# Patient Record
Sex: Female | Born: 2000 | Race: White | Hispanic: No | Marital: Single | Smoking: Never smoker
Health system: Southern US, Community
[De-identification: ages and names within clinical notes are randomized; demographics above are authoritative.]

## PROBLEM LIST (undated history)

## (undated) DIAGNOSIS — F329 Major depressive disorder, single episode, unspecified: Secondary | ICD-10-CM

## (undated) DIAGNOSIS — F32A Depression, unspecified: Secondary | ICD-10-CM

## (undated) DIAGNOSIS — S060X9A Concussion with loss of consciousness of unspecified duration, initial encounter: Secondary | ICD-10-CM

## (undated) HISTORY — PX: KNEE SURGERY: SHX244

## (undated) HISTORY — DX: Depression, unspecified: F32.A

## (undated) HISTORY — DX: Concussion with loss of consciousness of unspecified duration, initial encounter: S06.0X9A

## (undated) HISTORY — DX: Major depressive disorder, single episode, unspecified: F32.9

---
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
## 2019-01-06 ENCOUNTER — Other Ambulatory Visit: Payer: Self-pay

## 2019-01-06 ENCOUNTER — Ambulatory Visit (INDEPENDENT_AMBULATORY_CARE_PROVIDER_SITE_OTHER): Payer: 59 | Admitting: Psychology

## 2019-01-06 DIAGNOSIS — F411 Generalized anxiety disorder: Secondary | ICD-10-CM | POA: Diagnosis not present

## 2019-01-06 DIAGNOSIS — F330 Major depressive disorder, recurrent, mild: Secondary | ICD-10-CM | POA: Diagnosis not present

## 2019-01-06 NOTE — Progress Notes (Signed)
Virtual Visit via Video Note  I connected with Caroline Lopez on 01/06/19 at  2:30 PM EST by a video enabled telemedicine application and verified that I am speaking with the correct person using two identifiers.   I discussed the limitations of evaluation and management by telemedicine and the availability of in person appointments. The patient expressed understanding and agreed to proceed.   I discussed the assessment and treatment plan with the patient. The patient was provided an opportunity to ask questions and all were answered. The patient agreed with the plan and demonstrated an understanding of the instructions.   The patient was advised to call back or seek an in-person evaluation if the symptoms worsen or if the condition fails to improve as anticipated.  I provided 32 minutes of non-face-to-face time during this encounter.   [name] [facility]    THERAPIST PROGRESS NOTE  Session Time: 2.38pm-3.10pm  Participation Level: Active  Behavioral Response: Well GroomedAlertaffect wnl  Type of Therapy: Individual Therapy  Treatment Goals addressed: Diagnosis: GAD, MDD and goal 1  Interventions: CBT and Strength-based  Summary: Caroline Lopez is a 18 y.o. female who presents with affect bright. Pt reported that she finished her last final this week and feels good about the semester- pt completed w/ As and 1 B.  Pt reported that she did struggle a couple of weeks w/ mood when w/out the effexor.  Pt reported that she has done well over the past several of weeks.  Pt reported that she has improved w/ her sleep schedule, improved w/ less picking and improved w/ decreased ruminating about previous relationship.  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 Pt discussed her plans for the winter break w/ work, social and family time. Pt requested to continue w/ counselor during break more frequently as won't have access to campus services.    Suicidal/Homicidal: Nowithout intent/plan  Therapist Response: Assessed pt current functioning per pt report.  Processed w/pt coping w/ anxiety and stress through the semester and what coping skills and self care assisted.  Explored w/pt transition returning to home.  Discussed f/u during this time.  Plan: Return again in 3 weeks, via webex. Pt will f/u biweekly when home for winter break.  Pt to schedule f/u w/ Dr. Montel Culver for medication management.   Diagnosis: GAD, MDD  [name], [facility] 01/06/2019

## 2019-01-24 ENCOUNTER — Other Ambulatory Visit: Payer: Self-pay

## 2019-01-24 ENCOUNTER — Encounter (HOSPITAL_COMMUNITY): Payer: Self-pay | Admitting: Psychiatry

## 2019-01-24 ENCOUNTER — Ambulatory Visit (INDEPENDENT_AMBULATORY_CARE_PROVIDER_SITE_OTHER): Payer: 59 | Admitting: Psychiatry

## 2019-01-24 DIAGNOSIS — F3342 Major depressive disorder, recurrent, in full remission: Secondary | ICD-10-CM | POA: Diagnosis not present

## 2019-01-24 DIAGNOSIS — F411 Generalized anxiety disorder: Secondary | ICD-10-CM | POA: Diagnosis not present

## 2019-01-24 MED ORDER — BUPROPION HCL ER (XL) 150 MG PO TB24: 150.0000 mg | ORAL_TABLET | Freq: Every day | ORAL | 1 refills | Status: DC

## 2019-01-24 MED ORDER — BUSPIRONE HCL 10 MG PO TABS: 20.0000 mg | ORAL_TABLET | Freq: Two times a day (BID) | ORAL | 2 refills | Status: DC

## 2019-01-24 MED ORDER — TRAZODONE HCL 50 MG PO TABS: ORAL_TABLET | ORAL | 1 refills | Status: DC

## 2019-01-24 MED ORDER — HYDROXYZINE HCL 10 MG PO TABS: 10.0000 mg | ORAL_TABLET | Freq: Three times a day (TID) | ORAL | 2 refills | Status: AC | PRN

## 2019-01-24 MED ORDER — VENLAFAXINE HCL ER 75 MG PO CP24: 225.0000 mg | ORAL_CAPSULE | Freq: Every day | ORAL | 2 refills | Status: DC

## 2019-01-24 NOTE — Progress Notes (Signed)
BH MD/PA/NP OP Progress Note  01/24/2019 9:54 AM Caroline Lopez  MRN:  [id number] Interview was conducted by phone and I verified that I was speaking with the correct person using two identifiers. I discussed the limitations of evaluation and management by telemedicine and  the availability of in person appointments. Patient expressed understanding and agreed to proceed.  Chief Complaint: Problems with focusing/sustaining attention.  HPI: 18 yo single white female who has been previously followed by Dr. Milana Lopez for MDD/GAD. Last visit with her was. Caroline Lopez reports some anxiety and no depression. She takes hydroxyzine 10 mg prn with good effect. She has been on Effexor 225 mg, buspirone 20 mg bid and trazodone prn insomnia. All appear to work well. No adverse effects reported. No hopelessness, no SI reported. She started at [employer] last semester and noticed more problems with focusing, concetration, struggling with assignment completion, procrastination. These problems are not new but seems to become more apparent in a new environment. She has two fist cousions diagnosed with ADHD. There is no hyperactivity component described.   Visit Diagnosis:    ICD-10-CM   1. GAD (generalized anxiety disorder)  F41.1   2. Major depressive disorder, recurrent episode, in full remission (HCC)  F33.42     Past Psychiatric History: Please see intake H&P.  Past Medical History:  Past Medical History:  Diagnosis Date  . Concussion 04/2017  . Depression     Past Surgical History:  Procedure Laterality Date  . KNEE SURGERY     2017    Family Psychiatric History: Reviewed.  Family History:  Family History  Problem Relation Age of Onset  . Bipolar disorder Maternal Uncle   . Drug abuse Maternal Uncle   . Suicidality Maternal Uncle   . Suicidality Other        completed suicide  . ADD / ADHD Cousin     Social History:  Social History   Socioeconomic History  . Marital status: Single    Spouse name:  Not on file  . Number of children: Not on file  . Years of education: Not on file  . Highest education level: Not on file  Occupational History  . Occupation: Consulting civil engineer  Social Needs  . Financial resource strain: Not on file  . Food insecurity    Worry: Not on file    Inability: Not on file  . Transportation needs    Medical: Not on file    Non-medical: Not on file  Tobacco Use  . Smoking status: Never Smoker  . Smokeless tobacco: Never Used  Substance and Sexual Activity  . Alcohol use: No  . Drug use: No  . Sexual activity: Yes    Birth control/protection: Implant  Lifestyle  . Physical activity    Days per week: Not on file    Minutes per session: Not on file  . Stress: Not on file  Relationships  . Social Musician on phone: Not on file    Gets together: Not on file    Attends religious service: Not on file    Active member of club or organization: Not on file    Attends meetings of clubs or organizations: Not on file    Relationship status: Not on file  Other Topics Concern  . Not on file  Social History Narrative  . Not on file    Allergies: No Known Allergies  Metabolic Disorder Labs: No results found for: HGBA1C, MPG No results found for: PROLACTIN No results  found for: CHOL, TRIG, HDL, CHOLHDL, VLDL, LDLCALC Lab Results  Component Value Date   TSH 2.76 06/12/2017    Therapeutic Level Labs: No results found for: LITHIUM No results found for: VALPROATE No components found for:  CBMZ  Current Medications: Current Outpatient Medications  Medication Sig Dispense Refill  . buPROPion (WELLBUTRIN XL) 150 MG 24 hr tablet Take 1 tablet (150 mg total) by mouth daily. 30 tablet 1  . busPIRone (BUSPAR) 10 MG tablet Take 2 tablets (20 mg total) by mouth 2 (two) times daily. 120 tablet 2  . etonogestrel (NEXPLANON) 68 MG IMPL implant 1 each by Subdermal route once.    . hydrOXYzine (ATARAX/VISTARIL) 10 MG tablet Take 1 tablet (10 mg total) by mouth 3  (three) times daily as needed for anxiety. 90 tablet 2  . traZODone (DESYREL) 50 MG tablet Take one or two each evening 120 tablet 1  . venlafaxine XR (EFFEXOR-XR) 75 MG 24 hr capsule Take 3 capsules (225 mg total) by mouth daily with breakfast. Take 3 each morning 90 capsule 2   No current facility-administered medications for this visit.       Psychiatric Specialty Exam: Review of Systems  Psychiatric/Behavioral: The patient is nervous/anxious.   All other systems reviewed and are negative.   There were no vitals taken for this visit.There is no height or weight on file to calculate BMI.  General Appearance: NA  Eye Contact:  NA  Speech:  Clear and Coherent and Normal Rate  Volume:  Normal  Mood:  Anxious  Affect:  NA  Thought Process:  Goal Directed and Linear  Orientation:  Full (Time, Place, and Person)  Thought Content: Logical   Suicidal Thoughts:  No  Homicidal Thoughts:  No  Memory:  Immediate;   Good Recent;   Good Remote;   Good  Judgement:  Good  Insight:  Good  Psychomotor Activity:  NA  Concentration:  Concentration: Fair  Recall:  Good  Fund of Knowledge: Good  Language: Good  Akathisia:  Negative  Handed:  Right  AIMS (if indicated): not done  Assets:  Communication Skills Desire for Improvement Housing Physical Health Social Support Talents/Skills  ADL's:  Intact  Cognition: WNL  Sleep:  Fair   Screenings: GAD-7     Counselor from 09/09/2017 in BEHAVIORAL HEALTH OUTPATIENT THERAPY [place]  Total GAD-7 Score  19    PHQ2-9     Counselor from 09/09/2017 in BEHAVIORAL HEALTH OUTPATIENT THERAPY [place]  PHQ-2 Total Score  5  PHQ-9 Total Score  23       Assessment and Plan: 18 yo single white female who has been previously followed by Dr. Milana KidneyHoover for MDD/GAD. Last visit with her was. Caroline Lopez reports some anxiety and no depression. She takes hydroxyzine 10 mg prn with good effect. She has been on Effexor 225 mg, buspirone 20 mg bid and trazodone  prn insomnia. All appear to work well. No adverse effects reported. No hopelessness, no SI reported. She started at [employer] last semester and noticed more problems with focusing, concetration, struggling with assignment completion, procrastination. These problems are not new but seems to become more apparent in a new environment. She has two fist cousions diagnosed with ADHD. There is no hyperactivity component described.  Dx: GAD; MDD in remission; Suspected ADD  Plan: Continue current meds unchanged but add bupropion XL 150 mg in AM. If focusing does not improve we could try a stimulant next. Next appointment in 5 weeks. The plan was discussed  with patient who had an opportunity to ask questions and these were all answered. I spend 25 minutes in videoconferencing with the patient.    Stephanie Acre, MD 01/24/2019, 9:54 AM

## 2019-01-27 ENCOUNTER — Ambulatory Visit (INDEPENDENT_AMBULATORY_CARE_PROVIDER_SITE_OTHER): Payer: 59 | Admitting: Psychology

## 2019-01-27 ENCOUNTER — Other Ambulatory Visit: Payer: Self-pay

## 2019-01-27 DIAGNOSIS — F330 Major depressive disorder, recurrent, mild: Secondary | ICD-10-CM

## 2019-01-27 DIAGNOSIS — F411 Generalized anxiety disorder: Secondary | ICD-10-CM | POA: Diagnosis not present

## 2019-01-27 NOTE — Progress Notes (Signed)
Virtual Visit via Video Note  I connected with Caroline Lopez on 01/27/19 at  9:00 AM EST by a video enabled telemedicine application and verified that I am speaking with the correct person using two identifiers.   I discussed the limitations of evaluation and management by telemedicine and the availability of in person appointments. The patient expressed understanding and agreed to proceed.     I discussed the assessment and treatment plan with the patient. The patient was provided an opportunity to ask questions and all were answered. The patient agreed with the plan and demonstrated an understanding of the instructions.   The patient was advised to call back or seek an in-person evaluation if the symptoms worsen or if the condition fails to improve as anticipated.  I provided 48 minutes of non-face-to-face time during this encounter.   [name] [facility]    THERAPIST PROGRESS NOTE  Session Time: 9am-9.48am  Participation Level: Active  Behavioral Response: Well GroomedAlertaffect wnl  Type of Therapy: Individual Therapy  Treatment Goals addressed: Diagnosis: GAD, MDD and goal 1.  Interventions: CBT and Strength-based  Summary: Caroline Lopez is a 18 y.o. female who presents with affect wnl.  pt reported returning home last week.  Pt reported that she is starting back to work today and feels nervous about as current outbreak of covid.  Pt knows short staff so doesn't want to quit, but wants to be safe and not risk bringing home.  Pt discussed how will see how feels about after working today.  Pt reported that different being home- as used to freedom of being at college and w/ car broken down- even more limited.  Pt reported that she has been nervous to run into her ex, but also wants to happen to just get out of the way.   Pt disclosed that she did cut about a month ago- first in long time and aware impact of not being on meds at the time and feeling overwhelmed. Pt acknowledged  that put self down about doing and is reframing that for self.  Pt reported some minor urges to cut since, but able to easily refrain and move forwared.  Pt discussed supports that she reached out to and that was helpful.  Pt discussed want for an emotional support animal and how to go about looking into school requirements for.   Suicidal/Homicidal: Nowithout intent/plan  Therapist Response: assessed pt current functioning per pt report.  Processed w/pt coping w/ transition home for winter break.  Explored w/pt worry about work, validated and discussed how can make decision as goes about continuing or not.  Processed wpt incident of superficial cut a month ago- triggers and ways of coping in future w/ use of supports.    Plan: Return again in 2 weeks, via webex.  Pt to f/u as scheduled w/ Dr. Montel Culver.  Diagnosis: [name], MDD   [name] [facility] 01/27/2019

## 2019-01-31 ENCOUNTER — Encounter: Payer: Self-pay | Admitting: *Deleted

## 2019-02-02 ENCOUNTER — Encounter: Payer: Self-pay | Admitting: Family Medicine

## 2019-02-02 ENCOUNTER — Ambulatory Visit (INDEPENDENT_AMBULATORY_CARE_PROVIDER_SITE_OTHER): Payer: Self-pay | Admitting: Family Medicine

## 2019-02-02 ENCOUNTER — Other Ambulatory Visit: Payer: Self-pay

## 2019-02-02 VITALS — BP 118/90 | HR 111 | Ht 66.0 in | Wt 264.0 lb

## 2019-02-02 DIAGNOSIS — M542 Cervicalgia: Secondary | ICD-10-CM

## 2019-02-02 DIAGNOSIS — M999 Biomechanical lesion, unspecified: Secondary | ICD-10-CM

## 2019-02-02 NOTE — Progress Notes (Signed)
[name] [address] [place], [place] [address] Phone: [phone] Subjective:   Bruce Donath, am serving as a scribe for Dr. Antoine Primas. This visit occurred during the SARS-CoV-2 public health emergency.  Safety protocols were in place, including screening questions prior to the visit, additional usage of staff PPE, and extensive cleaning of exam room while observing appropriate contact time as indicated for disinfecting solutions.  I'm seeing this patient by the request  of:    CC: Neck and upper back pain follow-up  GHW:[id number]  Caroline Lopez is a 18 y.o. female coming in with complaint of back pain. Is here for OMT to manage her back pain. Last adjustment July 2020. Patient states that she is having pain in between the scapula. Is back working at [employer] and feels like this is contributing to her pain. Continues to have knee and hip pain as well.      Past Medical History:  Diagnosis Date  . Concussion 04/2017  . Depression    Past Surgical History:  Procedure Laterality Date  . KNEE SURGERY     2017   Social History   Socioeconomic History  . Marital status: Single    Spouse name: Not on file  . Number of children: Not on file  . Years of education: Not on file  . Highest education level: Not on file  Occupational History  . Occupation: Consulting civil engineer  Tobacco Use  . Smoking status: Never Smoker  . Smokeless tobacco: Never Used  Substance and Sexual Activity  . Alcohol use: No  . Drug use: No  . Sexual activity: Yes    Birth control/protection: Implant  Other Topics Concern  . Not on file  Social History Narrative  . Not on file   Social Determinants of Health   Financial Resource Strain:   . Difficulty of Paying Living Expenses: Not on file  Food Insecurity:   . Worried About Programme researcher, broadcasting/film/video in the Last Year: Not on file  . Ran Out of Food in the Last Year: Not on file  Transportation Needs:   . Lack of  Transportation (Medical): Not on file  . Lack of Transportation (Non-Medical): Not on file  Physical Activity:   . Days of Exercise per Week: Not on file  . Minutes of Exercise per Session: Not on file  Stress:   . Feeling of Stress : Not on file  Social Connections:   . Frequency of Communication with Friends and Family: Not on file  . Frequency of Social Gatherings with Friends and Family: Not on file  . Attends Religious Services: Not on file  . Active Member of Clubs or Organizations: Not on file  . Attends Banker Meetings: Not on file  . Marital Status: Not on file   No Known Allergies Family History  Problem Relation Age of Onset  . Bipolar disorder Maternal Uncle   . Drug abuse Maternal Uncle   . Suicidality Maternal Uncle   . Suicidality Other        completed suicide  . ADD / ADHD Cousin     Current Outpatient Medications (Endocrine & Metabolic):  .  etonogestrel (NEXPLANON) 68 MG IMPL implant, 1 each by Subdermal route once.      Current Outpatient Medications (Other):  [name]  buPROPion (WELLBUTRIN XL) 150 MG 24 hr tablet, Take 1 tablet (150 mg total) by mouth daily. .  busPIRone (BUSPAR) 10 MG tablet, Take 2 tablets (  20 mg total) by mouth 2 (two) times daily. .  hydrOXYzine (ATARAX/VISTARIL) 10 MG tablet, Take 1 tablet (10 mg total) by mouth 3 (three) times daily as needed for anxiety. .  traZODone (DESYREL) 50 MG tablet, Take one or two each evening .  venlafaxine XR (EFFEXOR-XR) 75 MG 24 hr capsule, Take 3 capsules (225 mg total) by mouth daily with breakfast. Take 3 each morning    Past medical history, social, surgical and family history all reviewed in electronic medical record.  No pertanent information unless stated regarding to the chief complaint.   Review of Systems:  No , visual changes, nausea, vomiting, diarrhea, constipation, dizziness, abdominal pain, skin rash, fevers, chills, night sweats, weight loss, swollen lymph nodes, body aches,  joint swelling, muscle aches, chest pain, shortness of breath, mood changes.  Positive headaches  Objective  Blood pressure 118/90, pulse (!) 111, height 5\' 6"  (1.676 m), weight 264 lb (119.7 kg), SpO2 98 %.     General: No apparent distress alert and oriented x3 mood and affect normal, dressed appropriately.  HEENT: Pupils equal, extraocular movements intact  Respiratory: Patient's speak in full sentences and does not appear short of breath  Cardiovascular: No lower extremity edema, non tender, no erythema  Skin: Warm dry intact with no signs of infection or rash on extremities or on axial skeleton.  Abdomen: Soft nontender  Neuro: Cranial nerves II through XII are intact, neurovascularly intact in all extremities with 2+ DTRs and 2+ pulses.  Lymph: No lymphadenopathy of posterior or anterior cervical chain or axillae bilaterally.  Gait normal with good balance and coordination.  MSK:  Non tender with full range of motion and good stability and symmetric strength and tone of shoulders, elbows, wrist, hip, knee and ankles bilaterally.  Neck exam shows mild loss of lordosis.  Patient does have some mild tightness in the parascapular region bilaterally.  No spinous process tenderness noted no.  Negative Spurling's.  5 out of 5 strength of the upper extremities.  No real true loss of range of motion  Osteopathic findings  C4 flexed rotated and side bent left C6 flexed rotated and side bent left T6 extended rotated and side bent left  L1 flexed rotated and side bent right Sacrum right on right    Impression and Recommendations:     This case required medical decision making of moderate complexity. The above documentation has been reviewed and is accurate and complete Lyndal Pulley, DO       Note: This dictation was prepared with Dragon dictation along with smaller phrase technology. Any transcriptional errors that result from this process are unintentional.

## 2019-02-02 NOTE — Patient Instructions (Addendum)
  [address], [address]st [address] [place], [place] [address] Phone [phone]  Good to see you Happy Holidays!   

## 2019-02-02 NOTE — Assessment & Plan Note (Signed)
Decision today to treat with OMT was based on Physical Exam  After verbal consent patient was treated with HVLA, ME, FPR techniques in cervical, thoracic, lumbar and sacral areas  Patient tolerated the procedure well with improvement in symptoms  Patient given exercises, stretches and lifestyle modifications  See medications in patient instructions if given  Patient will follow up in 4-8 weeks 

## 2019-02-02 NOTE — Assessment & Plan Note (Signed)
Stable, respond well to osteopathic manipulation.  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  Patient will increase activity slowly over the course of several weeks.  Follow-up with me again 4 to 8 weeks

## 2019-02-07 ENCOUNTER — Ambulatory Visit (INDEPENDENT_AMBULATORY_CARE_PROVIDER_SITE_OTHER): Payer: 59 | Admitting: Psychology

## 2019-02-07 ENCOUNTER — Other Ambulatory Visit: Payer: Self-pay

## 2019-02-07 DIAGNOSIS — F330 Major depressive disorder, recurrent, mild: Secondary | ICD-10-CM

## 2019-02-07 DIAGNOSIS — F411 Generalized anxiety disorder: Secondary | ICD-10-CM

## 2019-02-07 NOTE — Progress Notes (Signed)
Virtual Visit via Video Note  I connected with Caroline Lopez on 02/07/19 at  9:00 AM EST by a video enabled telemedicine application and verified that I am speaking with the correct person using two identifiers.   I discussed the limitations of evaluation and management by telemedicine and the availability of in person appointments. The patient expressed understanding and agreed to proceed.     I discussed the assessment and treatment plan with the patient. The patient was provided an opportunity to ask questions and all were answered. The patient agreed with the plan and demonstrated an understanding of the instructions.   The patient was advised to call back or seek an in-person evaluation if the symptoms worsen or if the condition fails to improve as anticipated.  I provided 45 minutes of non-face-to-face time during this encounter.   [name] [facility]    THERAPIST PROGRESS NOTE  Session Time: 9am-9.45am  Participation Level: Active  Behavioral Response: Well GroomedAlertaffect wnl  Type of Therapy: Individual Therapy  Treatment Goals addressed: Diagnosis: GAD, MDD and goal 1.  Interventions: CBT and Supportive  Summary: Caroline Lopez is a 18 y.o. female who presents with affect wnl.  Pt reported that she has been doing well overall w/ mood. Pt reported return to work has been good- working on the unit she prefers and concerns about COVID hasn't been as bad as thought.  pt reported that she has been able to hang out w/ friends a little.  Pt reported that she finds that she is thinking about her ex more since being back home. Pt had anticipated that would run into and hasn't.  pt also thinking about if he still cares about her even if not together.  Pt acknowledges doesn't want to get together and likely being home and not as much w/ friends impacting.  Pt discussed keeping good boundaries for self to not further ruminate on. Pt reported that she has looked into requirements  for emotional support animal at [employer] and is going to pursue this. Pt is having some worries about going to new gynecologist and procedure.  Pt discussed spending for Christmas and having to reevaluate how much spending on others so has money going into new semester.  Suicidal/Homicidal: Nowithout intent/plan  Therapist Response: Assessed pt current functioning per pt report. Processed w/ pt coping w/ interactions and stressors since being home.  Normalized and validated feelings re: ex and thoughts of since being home.  Discussed ways of keeping healthy boundaries for self to  Manage through.  Explored w/ pt upcoming plans and concerns.    Plan: Return again in 2 weeks, via webex.  Pt to f/u as scheduled w/ dr. Montel Culver.   Diagnosis: GAD, MDD    [name], [facility] 02/07/2019

## 2019-02-15 ENCOUNTER — Encounter (HOSPITAL_COMMUNITY): Payer: Self-pay | Admitting: Psychology

## 2019-02-21 ENCOUNTER — Other Ambulatory Visit: Payer: Self-pay

## 2019-02-21 ENCOUNTER — Ambulatory Visit (INDEPENDENT_AMBULATORY_CARE_PROVIDER_SITE_OTHER): Payer: 59 | Admitting: Psychology

## 2019-02-21 DIAGNOSIS — F411 Generalized anxiety disorder: Secondary | ICD-10-CM | POA: Diagnosis not present

## 2019-02-21 DIAGNOSIS — F330 Major depressive disorder, recurrent, mild: Secondary | ICD-10-CM

## 2019-02-21 NOTE — Progress Notes (Signed)
Virtual Visit via Video Note  I connected with Caroline Lopez on 02/21/19 at  9:00 AM EST by a video enabled telemedicine application and verified that I am speaking with the correct person using two identifiers.   I discussed the limitations of evaluation and management by telemedicine and the availability of in person appointments. The patient expressed understanding and agreed to proceed.     I discussed the assessment and treatment plan with the patient. The patient was provided an opportunity to ask questions and all were answered. The patient agreed with the plan and demonstrated an understanding of the instructions.   The patient was advised to call back or seek an in-person evaluation if the symptoms worsen or if the condition fails to improve as anticipated.  I provided 34 minutes of non-face-to-face time during this encounter.   Forde Radon [facility]    THERAPIST PROGRESS NOTE  Session Time: 9am-9.34am  Participation Level: Active  Behavioral Response: Well GroomedAlertaffect wnl  Type of Therapy: Individual Therapy  Treatment Goals addressed: Diagnosis: GAD,  MDD and goal 1.  Interventions: CBT and Supportive  Summary: Caroline Lopez is a 19 y.o. female who presents with affect wnl.  Pt introduced to potential dog that she is trying out for next couple of days.  Pt is working w/ [employer] manager to find good fit and has tried 2 other dogs that weren't a good fit.  Pt reported that she had a good Christmas and new years- spending time w/ family and has been able to get together w/ friends.  Pt reported that she worked a lot last week and enjoyed 3 days off this weekend.  Pt reported that she did have a "break down" episode last week w/ feeling alone and was able to use coping skills of reaching out and talking w/ friend.  Pt good awareness that was exhausted from work week and with losses at her job.  Pt is looking forward to return to school and getting back into routine w/  friends.  Pt was able to identify cognitive distortion that everything will change w/ friend and friend group and able to reframe.  Pt shared about her OBGYN visit and want to see how things change w/ birth control removed.    Suicidal/Homicidal: Nowithout intent/plan  Therapist Response: Assessed pt current functioning per pt report.  Processed w/ pt coping w/ stressors and w/ emotional escalations.  Reflected good use of coping skills.  Explored w/pt cognitive distortions, assisted challenging and reframing.  Plan: Return again in 2 weeks, via webex. Pt to f/u as scheduled w/ Dr. Hinton Dyer.  Diagnosis: GAD, MDD  Forde Radon, [facility] 02/21/2019

## 2019-02-23 ENCOUNTER — Encounter: Payer: Self-pay | Admitting: Family Medicine

## 2019-02-25 ENCOUNTER — Ambulatory Visit: Payer: 59 | Admitting: Family Medicine

## 2019-02-25 ENCOUNTER — Encounter: Payer: Self-pay | Admitting: Family Medicine

## 2019-02-25 ENCOUNTER — Other Ambulatory Visit: Payer: Self-pay

## 2019-02-25 VITALS — BP 122/90 | HR 89 | Ht 66.0 in | Wt 259.0 lb

## 2019-02-25 DIAGNOSIS — M542 Cervicalgia: Secondary | ICD-10-CM | POA: Diagnosis not present

## 2019-02-25 DIAGNOSIS — M222X1 Patellofemoral disorders, right knee: Secondary | ICD-10-CM

## 2019-02-25 DIAGNOSIS — M999 Biomechanical lesion, unspecified: Secondary | ICD-10-CM | POA: Diagnosis not present

## 2019-02-25 MED ORDER — MELOXICAM 7.5 MG PO TABS: 7.5000 mg | ORAL_TABLET | Freq: Every day | ORAL | 0 refills | Status: AC

## 2019-02-25 NOTE — Progress Notes (Signed)
[name] [address] [place] [address] Phone: [phone] Subjective:   [name] am serving as a Neurosurgeon for Dr. Antoine Primas.  This visit occurred during the SARS-CoV-2 public health emergency.  Safety protocols were in place, including screening questions prior to the visit, additional usage of staff PPE, and extensive cleaning of exam room while observing appropriate contact time as indicated for disinfecting solutions.    CC: Low back pain follow-up, right knee pain  [id number]  Lucendia Leard is a 19 y.o. female coming in with complaint of back pain. Patient states she is sore due to working. Right knee cap she states she has injured it again. Has been wearing a brace and using biofreeze. States she's limping by the end of the day and the knee is not stable.  Patient feels like it is all on the anterior aspect of the leg.  This then in turn causes more discomfort in the back.     Past Medical History:  Diagnosis Date  . Concussion 04/2017  . Depression    Past Surgical History:  Procedure Laterality Date  . KNEE SURGERY     2017   Social History   Socioeconomic History  . Marital status: Single    Spouse name: Not on file  . Number of children: Not on file  . Years of education: Not on file  . Highest education level: Not on file  Occupational History  . Occupation: Consulting civil engineer  Tobacco Use  . Smoking status: Never Smoker  . Smokeless tobacco: Never Used  Substance and Sexual Activity  . Alcohol use: No  . Drug use: No  . Sexual activity: Yes    Birth control/protection: Implant  Other Topics Concern  . Not on file  Social History Narrative  . Not on file   Social Determinants of Health   Financial Resource Strain:   . Difficulty of Paying Living Expenses: Not on file  Food Insecurity:   . Worried About Programme researcher, broadcasting/film/video in the Last Year: Not on file  . Ran Out of Food in the Last Year: Not on file   Transportation Needs:   . Lack of Transportation (Medical): Not on file  . Lack of Transportation (Non-Medical): Not on file  Physical Activity:   . Days of Exercise per Week: Not on file  . Minutes of Exercise per Session: Not on file  Stress:   . Feeling of Stress : Not on file  Social Connections:   . Frequency of Communication with Friends and Family: Not on file  . Frequency of Social Gatherings with Friends and Family: Not on file  . Attends Religious Services: Not on file  . Active Member of Clubs or Organizations: Not on file  . Attends Banker Meetings: Not on file  . Marital Status: Not on file   No Known Allergies Family History  Problem Relation Age of Onset  . Bipolar disorder Maternal Uncle   . Drug abuse Maternal Uncle   . Suicidality Maternal Uncle   . Suicidality Other        completed suicide  . ADD / ADHD Cousin     Current Outpatient Medications (Endocrine & Metabolic):  .  etonogestrel (NEXPLANON) 68 MG IMPL implant, 1 each by Subdermal route once.    Current Outpatient Medications (Analgesics):  .  meloxicam (MOBIC) 7.5 MG tablet, Take 1 tablet (7.5 mg total) by mouth daily.   Current Outpatient Medications (Other):  .  buPROPion (WELLBUTRIN XL) 150 MG 24 hr tablet, Take 1 tablet (150 mg total) by mouth daily. .  busPIRone (BUSPAR) 10 MG tablet, Take 2 tablets (20 mg total) by mouth 2 (two) times daily. .  hydrOXYzine (ATARAX/VISTARIL) 10 MG tablet, Take 1 tablet (10 mg total) by mouth 3 (three) times daily as needed for anxiety. .  traZODone (DESYREL) 50 MG tablet, Take one or two each evening .  venlafaxine XR (EFFEXOR-XR) 75 MG 24 hr capsule, Take 3 capsules (225 mg total) by mouth daily with breakfast. Take 3 each morning    Past medical history, social, surgical and family history all reviewed in electronic medical record.  No pertanent information unless stated regarding to the chief complaint.   Review of Systems:  No  headache, visual changes, nausea, vomiting, diarrhea, constipation, dizziness, abdominal pain, skin rash, fevers, chills, night sweats, weight loss, swollen lymph nodes, body aches, joint swelling,chest pain, shortness of breath, mood changes.  Positive muscle aches  Objective  Blood pressure 122/90, pulse 89, height 5\' 6"  (1.676 m), weight 259 lb (117.5 kg), SpO2 98 %.    General: No apparent distress alert and oriented x3 mood and affect normal, dressed appropriately.  HEENT: Pupils equal, extraocular movements intact  Respiratory: Patient's speak in full sentences and does not appear short of breath  Cardiovascular: No lower extremity edema, non tender, no erythema  Skin: Warm dry intact with no signs of infection or rash on extremities or on axial skeleton.  Abdomen: Soft nontender  Neuro: Cranial nerves II through XII are intact, neurovascularly intact in all extremities with 2+ DTRs and 2+ pulses.  Lymph: No lymphadenopathy of posterior or anterior cervical chain or axillae bilaterally.  Gait normal with good balance and coordination.  MSK:  Non tender with full range of motion and good stability and symmetric strength and tone of shoulders, elbows, wrist, hip, and ankles bilaterally.   Right knee exam does show some lateral tracking of the patella noted.  Otherwise feels that patient does have stability in the knee noted.    Positive grind test.  No significant instability though noted. Back Exam:  Inspection: Mild loss of lordosis Motion: Flexion 45 deg, Extension 45 deg, Side Bending to 45 deg bilaterally,  Rotation to 45 deg bilaterally  SLR laying: Negative  XSLR laying: Negative  Palpable tenderness: Tender to palpation in the paraspinal musculature lumbar spine right greater than left. FABER: negative. Sensory change: Gross sensation intact to all lumbar and sacral dermatomes.  Reflexes: 2+ at both patellar tendons, 2+ at achilles tendons, Babinski's downgoing.  Strength at  foot  Plantar-flexion: 5/5 Dorsi-flexion: 5/5 Eversion: 5/5 Inversion: 5/5  Leg strength  Quad: 5/5 Hamstring: 5/5 Hip flexor: 5/5 Hip abductors: 5/5  Gait unremarkable.  Neck: Inspection mild loss of lordosis. No palpable stepoffs. Negative Spurling's maneuver. Full neck range of motion Grip strength and sensation normal in bilateral hands Strength good C4 to T1 distribution No sensory change to C4 to T1 Negative Hoffman sign bilaterally Reflexes normal Mild tightness in the parascapular region as well.  Osteopathic findings  C2 flexed rotated and side bent right C6 flexed rotated and side bent left T3 extended rotated and side bent right inhaled third rib T9 extended rotated and side bent left L2 flexed rotated and side bent right Sacrum right on right    Impression and Recommendations:     This case required medical decision making of moderate complexity. The above documentation has been reviewed and is accurate and  complete Lyndal Pulley, DO       Note: This dictation was prepared with Dragon dictation along with smaller phrase technology. Any transcriptional errors that result from this process are unintentional.

## 2019-02-25 NOTE — Patient Instructions (Addendum)
Good to see you Sent in meloxicam daily for 2 weeks then as needed See me when you are in town

## 2019-02-27 ENCOUNTER — Encounter: Payer: Self-pay | Admitting: Family Medicine

## 2019-02-27 DIAGNOSIS — M222X1 Patellofemoral disorders, right knee: Secondary | ICD-10-CM | POA: Insufficient documentation

## 2019-02-27 NOTE — Assessment & Plan Note (Signed)
Tru pull lite given.  Discussed which activities to do which wants to avoid.  Patient was to increase activity slowly.  Increase icing regimen.  Follow-up again in 4 to 8 weeks Tru pull lite given.  Worsening pain potential imaging may be necessary

## 2019-02-27 NOTE — Assessment & Plan Note (Signed)
Decision today to treat with OMT was based on Physical Exam  After verbal consent patient was treated with HVLA, ME, FPR techniques in cervical, thoracic, rib lumbar and sacral areas  Patient tolerated the procedure well with improvement in symptoms  Patient given exercises, stretches and lifestyle modifications  See medications in patient instructions if given  Patient will follow up in 4 to 8 weeks weeks

## 2019-02-27 NOTE — Assessment & Plan Note (Signed)
Continue mild tightness, do believe that some of the generalized anxiety disorder could be contributing.  Responding fairly well though to osteopathic manipulation.  Discussed posture and ergonomics, discussed which activities to do which wants to avoid.  Patient will increase activity slowly over the course the next several days.  Follow-up again in 4 to 8 weeks

## 2019-02-28 ENCOUNTER — Ambulatory Visit (INDEPENDENT_AMBULATORY_CARE_PROVIDER_SITE_OTHER): Payer: 59 | Admitting: Psychiatry

## 2019-02-28 ENCOUNTER — Other Ambulatory Visit: Payer: Self-pay

## 2019-02-28 DIAGNOSIS — F3342 Major depressive disorder, recurrent, in full remission: Secondary | ICD-10-CM

## 2019-02-28 DIAGNOSIS — F909 Attention-deficit hyperactivity disorder, unspecified type: Secondary | ICD-10-CM | POA: Insufficient documentation

## 2019-02-28 DIAGNOSIS — F411 Generalized anxiety disorder: Secondary | ICD-10-CM | POA: Diagnosis not present

## 2019-02-28 DIAGNOSIS — F422 Mixed obsessional thoughts and acts: Secondary | ICD-10-CM | POA: Diagnosis not present

## 2019-02-28 MED ORDER — AMPHETAMINE-DEXTROAMPHET ER 20 MG PO CP24: 20.0000 mg | ORAL_CAPSULE | Freq: Every day | ORAL | 0 refills | Status: DC

## 2019-02-28 MED ORDER — VENLAFAXINE HCL ER 150 MG PO CP24: 300.0000 mg | ORAL_CAPSULE | Freq: Every day | ORAL | 0 refills | Status: DC

## 2019-02-28 NOTE — Progress Notes (Signed)
BH MD/PA/NP OP Progress Note  02/28/2019 10:50 AM Caroline Lopez  MRN:  [id number] Interview was conducted by phone and I verified that I was speaking with the correct person using two identifiers. I discussed the limitations of evaluation and management by telemedicine and  the availability of in person appointments. Patient expressed understanding and agreed to proceed.  Chief Complaint: Lack of focus, obsessive worrying.  HPI: 19 yo single white female who has been previously followed by Caroline Lopez for MDD/GAD. Caroline Lopez reports some anxiety which comes on and off and appears to reflect her tendency to obsess about various things. She also admits to compulsions at times eg needs to drink water over and over to decrease anxiety but "it does not hep". She has had these "since I was a child". She reports no depression. She takes hydroxyzine 10 mg prn with fair effect but mostly forgets to do it. She has been on Effexor 225 mg, buspirone 20 mg bid and trazodone prn insomnia. All appear towork well. No adverse effects reported. No hopelessness, no SI reported. She started at [employer] last semester and noticed more problems with focusing, concetration, struggling with assignment completion, procrastination. These problems are not new but seems to become more apparent in a new environment. She has two fist cousions diagnosed with ADHD. There is no hyperactivity component described. We added Wellbutrin XL but it does not seem to be of much help here.  Visit Diagnosis:    ICD-10-CM   1. GAD (generalized anxiety disorder)  F41.1   2. Major depressive disorder, recurrent episode, in full remission (HCC)  F33.42   3. Adult ADHD  F90.9   4. Mixed obsessional thoughts and acts  F42.2     Past Psychiatric History: Please see intake H&P.  Past Medical History:  Past Medical History:  Diagnosis Date  . Concussion 04/2017  . Depression     Past Surgical History:  Procedure Laterality Date  . KNEE SURGERY      2017    Family Psychiatric History: Reviewed.  Family History:  Family History  Problem Relation Age of Onset  . Bipolar disorder Maternal Uncle   . Drug abuse Maternal Uncle   . Suicidality Maternal Uncle   . Suicidality Other        completed suicide  . ADD / ADHD Cousin     Social History:  Social History   Socioeconomic History  . Marital status: Single    Spouse name: Not on file  . Number of children: Not on file  . Years of education: Not on file  . Highest education level: Not on file  Occupational History  . Occupation: Consulting civil engineer  Tobacco Use  . Smoking status: Never Smoker  . Smokeless tobacco: Never Used  Substance and Sexual Activity  . Alcohol use: No  . Drug use: No  . Sexual activity: Yes    Birth control/protection: Implant  Other Topics Concern  . Not on file  Social History Narrative  . Not on file   Social Determinants of Health   Financial Resource Strain:   . Difficulty of Paying Living Expenses: Not on file  Food Insecurity:   . Worried About Programme researcher, broadcasting/film/video in the Last Year: Not on file  . Ran Out of Food in the Last Year: Not on file  Transportation Needs:   . Lack of Transportation (Medical): Not on file  . Lack of Transportation (Non-Medical): Not on file  Physical Activity:   . Days of  Exercise per Week: Not on file  . Minutes of Exercise per Session: Not on file  Stress:   . Feeling of Stress : Not on file  Social Connections:   . Frequency of Communication with Friends and Family: Not on file  . Frequency of Social Gatherings with Friends and Family: Not on file  . Attends Religious Services: Not on file  . Active Member of Clubs or Organizations: Not on file  . Attends Archivist Meetings: Not on file  . Marital Status: Not on file    Allergies: No Known Allergies  Metabolic Disorder Labs: No results found for: HGBA1C, MPG No results found for: PROLACTIN No results found for: CHOL, TRIG, HDL, CHOLHDL, VLDL,  LDLCALC Lab Results  Component Value Date   TSH 2.76 06/12/2017    Therapeutic Level Labs: No results found for: LITHIUM No results found for: VALPROATE No components found for:  CBMZ  Current Medications: Current Outpatient Medications  Medication Sig Dispense Refill  . amphetamine-dextroamphetamine (ADDERALL XR) 20 MG 24 hr capsule Take 1 capsule (20 mg total) by mouth daily. 30 capsule 0  . busPIRone (BUSPAR) 10 MG tablet Take 2 tablets (20 mg total) by mouth 2 (two) times daily. 120 tablet 2  . etonogestrel (NEXPLANON) 68 MG IMPL implant 1 each by Subdermal route once.    . hydrOXYzine (ATARAX/VISTARIL) 10 MG tablet Take 1 tablet (10 mg total) by mouth 3 (three) times daily as needed for anxiety. 90 tablet 2  . meloxicam (MOBIC) 7.5 MG tablet Take 1 tablet (7.5 mg total) by mouth daily. 90 tablet 0  . traZODone (DESYREL) 50 MG tablet Take one or two each evening 120 tablet 1  . venlafaxine XR (EFFEXOR-XR) 150 MG 24 hr capsule Take 2 capsules (300 mg total) by mouth daily with breakfast. Take 3 each morning 180 capsule 0   No current facility-administered medications for this visit.     Psychiatric Specialty Exam: Review of Systems  Psychiatric/Behavioral: Positive for decreased concentration. The patient is nervous/anxious.   All other systems reviewed and are negative.   There were no vitals taken for this visit.There is no height or weight on file to calculate BMI.  General Appearance: NA  Eye Contact:  NA  Speech:  Clear and Coherent and Normal Rate  Volume:  Normal  Mood:  Anxious  Affect:  NA  Thought Process:  Goal Directed and Linear  Orientation:  Full (Time, Place, and Person)  Thought Content: Obsessions   Suicidal Thoughts:  No  Homicidal Thoughts:  No  Memory:  Immediate;   Good Recent;   Good Remote;   Good  Judgement:  Good  Insight:  Fair  Psychomotor Activity:  NA  Concentration:  Concentration: Fair  Recall:  Good  Fund of Knowledge: Good   Language: Good  Akathisia:  Negative  Handed:  Right  AIMS (if indicated): not done  Assets:  Communication Skills Desire for Improvement Financial Resources/Insurance [place] Talents/Skills  ADL's:  Intact  Cognition: WNL  Sleep:  Good   Screenings: GAD-7     Counselor from 09/09/2017 in [place]  Total GAD-7 Score  19    PHQ2-9     Counselor from 09/09/2017 in [place]  PHQ-2 Total Score  5  PHQ-9 Total Score  23       Assessment and Plan: 19 yo single white female who has been previously followed by Dr. Melanee Left for  MDD/GAD. Leyanna reports some anxiety which comes on and off and appears to reflect her tendency to obsess about various things. She also admits to compulsions at times eg needs to drink water over and over to decrease anxiety but "it does not hep". She has had these problems since she was a child. She reports no depression. She takes hydroxyzine 10 mg prn with fair effect but mostly forgets to do it. She has been on Effexor 225 mg, buspirone 20 mg bid and trazodone prn insomnia. All appear towork well. No adverse effects reported. No hopelessness, no SI reported. She started at [employer] last semester and noticed more problems with focusing, concetration, struggling with assignment completion, procrastination. These problems are not new but seems to become more apparent in a new environment. She has two fist cousions diagnosed with ADHD. There is no hyperactivity component described. We added Wellbutrin XL but it does not seem to be of much help here.  Dx: GAD/OCD; MDD in remission; ADD adult;   Plan: Continue trazodone, buspirone and hydroxyzine, increase Effexor to 300 mg (for OCD) and stop Wellbutrin. Instead we will try Adderall XR 20 mg daily. Next appointment in one month. The plan was discussed with patient who had an opportunity to ask questions and these were  all answered. I spend25 minutes invideoconferencing with the patient.    Magdalene Patricia, MD 02/28/2019, 10:50 AM

## 2019-03-10 ENCOUNTER — Ambulatory Visit (INDEPENDENT_AMBULATORY_CARE_PROVIDER_SITE_OTHER): Payer: 59 | Admitting: Psychology

## 2019-03-10 ENCOUNTER — Other Ambulatory Visit: Payer: Self-pay

## 2019-03-10 DIAGNOSIS — F330 Major depressive disorder, recurrent, mild: Secondary | ICD-10-CM

## 2019-03-10 DIAGNOSIS — F411 Generalized anxiety disorder: Secondary | ICD-10-CM

## 2019-03-10 NOTE — Progress Notes (Signed)
Virtual Visit via Video Note  I connected with Caroline Lopez on 03/10/19 at  9:00 AM EST by a video enabled telemedicine application and verified that I am speaking with the correct person using two identifiers.   I discussed the limitations of evaluation and management by telemedicine and the availability of in person appointments. The patient expressed understanding and agreed to proceed.     I discussed the assessment and treatment plan with the patient. The patient was provided an opportunity to ask questions and all were answered. The patient agreed with the plan and demonstrated an understanding of the instructions.   The patient was advised to call back or seek an in-person evaluation if the symptoms worsen or if the condition fails to improve as anticipated.  I provided 40 minutes of non-face-to-face time during this encounter.   Forde Radon [facility]    THERAPIST PROGRESS NOTE  Session Time: 9am-9.40am  Participation Level: Active  Behavioral Response: Well GroomedAlertaffect wnl  Type of Therapy: Individual Therapy  Treatment Goals addressed: Diagnosis: GAD, MDD and goal 1.  Interventions: CBT and Strength-based  Summary: Caroline Lopez is a 19 y.o. female who presents with affect wnl.  pt reported that she returned to campus on 1/17 and started classes on 1/19- taking Math, Bio and Lab, Pep Band and [place].  Pt reported that she has discovered that Lola her emotional support animal- barks a lot when she is gone.  Pt reported also she isn't able to take her as many places as she had initially thought- only outside and in her room.  Pt thought would be able to take into common areas of dorm.  Pt is struggling w/ this as wants to be w/ her but doesn't want to be confined to her room.  Pt reported that her math class seems that will be a challenge as not her strong subject and will be learning all through a program.  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  Pt did report that blood work came back form OBGYN visit and her prolactin levels are really high and also hypothyroid- pt has been referred to endocrinologist. Pt reports she will return to her oncampus counseling and f/u once a month to stay connected.   Suicidal/Homicidal: Nowithout intent/plan  Therapist Response: Assessed pt current functioning per pt report. Processed w/pt coping w/ transition back to campus and identifying some stressors and barriers.  Exploring w/ pt ways of seeking support and problem solving.  Discussed plan for f/u.   Plan: Return again in 4 weeks, via webex. Pt to f/u w/ campus counseling.  Pt to f/u w/ Dr. Cottie Banda and referral to endocrinologist.   Diagnosis: GAD, MDD   Forde Radon [facility] 03/10/2019

## 2019-03-23 ENCOUNTER — Encounter (HOSPITAL_COMMUNITY): Payer: Self-pay

## 2019-04-01 ENCOUNTER — Other Ambulatory Visit: Payer: Self-pay

## 2019-04-01 ENCOUNTER — Ambulatory Visit (INDEPENDENT_AMBULATORY_CARE_PROVIDER_SITE_OTHER): Payer: 59 | Admitting: Psychiatry

## 2019-04-01 DIAGNOSIS — F909 Attention-deficit hyperactivity disorder, unspecified type: Secondary | ICD-10-CM

## 2019-04-01 DIAGNOSIS — F411 Generalized anxiety disorder: Secondary | ICD-10-CM | POA: Diagnosis not present

## 2019-04-01 DIAGNOSIS — F422 Mixed obsessional thoughts and acts: Secondary | ICD-10-CM

## 2019-04-01 DIAGNOSIS — F3342 Major depressive disorder, recurrent, in full remission: Secondary | ICD-10-CM | POA: Diagnosis not present

## 2019-04-01 MED ORDER — VENLAFAXINE HCL ER 150 MG PO CP24: 300.0000 mg | ORAL_CAPSULE | Freq: Every day | ORAL | 0 refills | Status: DC

## 2019-04-01 MED ORDER — AMPHETAMINE-DEXTROAMPHET ER 30 MG PO CP24: 30.0000 mg | ORAL_CAPSULE | Freq: Every day | ORAL | 0 refills | Status: DC

## 2019-04-01 MED ORDER — BUSPIRONE HCL 10 MG PO TABS: 20.0000 mg | ORAL_TABLET | Freq: Two times a day (BID) | ORAL | 2 refills | Status: DC

## 2019-04-01 NOTE — Progress Notes (Signed)
BH MD/PA/NP OP Progress Note  04/01/2019 9:14 AM Caroline Lopez  MRN:  [id number] Interview was conducted by phone and I verified that I was speaking with the correct person using two identifiers. I discussed the limitations of evaluation and management by telemedicine and  the availability of in person appointments. Patient expressed understanding and agreed to proceed.  Chief Complaint: "Focusing better but not quite there yet".  HPI: 18yo single white female who has been previouslyfollowed by Dr. Milana Lopez for MDD/GAD. Season reports some anxietywhich comes on and off and appears to reflect her tendency to obsess about various things. She also admits to compulsions at times eg needs to drink water over and over to decrease anxiety but "it does not help much". She has had these problems since she was a child. She reports no depression. She takes hydroxyzine10 mgprn with fair effect but mostly forgets to do it. She has been now on a higher 300 mg dose of Effexor with decrease of anxiety/obsessiveness. Also takes buspirone 20 mg bid and trazodone prn insomnia. All appear towork well. Noadverse effects reported. No hopelessness, no SI reported. She started at [employer] last semester and noticed more problems with focusing, concentration, struggling with assignment completion, procrastination. These problems are not new but seems to become more apparent in a new environment. She has two first cousions diagnosed with ADHD. There is no hyperactivity component described. We added Wellbutrin XL but it did not help with concentration. Adderall XR 20 mg did but she still struggles some. No adverse effects besides some decrease in appetite described.   Visit Diagnosis:    ICD-10-CM   1. GAD (generalized anxiety disorder)  F41.1   2. Major depressive disorder, recurrent episode, in full remission (HCC)  F33.42   3. Mixed obsessional thoughts and acts  F42.2   4. Adult ADHD  F90.9     Past Psychiatric History:  Please see intake H&P.  Past Medical History:  Past Medical History:  Diagnosis Date  . Concussion 04/2017  . Depression     Past Surgical History:  Procedure Laterality Date  . KNEE SURGERY     2017    Family Psychiatric History: Reviewed.  Family History:  Family History  Problem Relation Age of Onset  . Bipolar disorder Maternal Uncle   . Drug abuse Maternal Uncle   . Suicidality Maternal Uncle   . Suicidality Other        completed suicide  . ADD / ADHD Cousin     Social History:  Social History   Socioeconomic History  . Marital status: Single    Spouse name: Not on file  . Number of children: Not on file  . Years of education: Not on file  . Highest education level: Not on file  Occupational History  . Occupation: Consulting civil engineer  Tobacco Use  . Smoking status: Never Smoker  . Smokeless tobacco: Never Used  Substance and Sexual Activity  . Alcohol use: No  . Drug use: No  . Sexual activity: Yes    Birth control/protection: Implant  Other Topics Concern  . Not on file  Social History Narrative  . Not on file   Social Determinants of Health   Financial Resource Strain:   . Difficulty of Paying Living Expenses: Not on file  Food Insecurity:   . Worried About Programme researcher, broadcasting/film/video in the Last Year: Not on file  . Ran Out of Food in the Last Year: Not on file  Transportation Needs:   .  Lack of Transportation (Medical): Not on file  . Lack of Transportation (Non-Medical): Not on file  Physical Activity:   . Days of Exercise per Week: Not on file  . Minutes of Exercise per Session: Not on file  Stress:   . Feeling of Stress : Not on file  Social Connections:   . Frequency of Communication with Friends and Family: Not on file  . Frequency of Social Gatherings with Friends and Family: Not on file  . Attends Religious Services: Not on file  . Active Member of Clubs or Organizations: Not on file  . Attends Archivist Meetings: Not on file  . Marital  Status: Not on file    Allergies: No Known Allergies  Metabolic Disorder Labs: No results found for: HGBA1C, MPG No results found for: PROLACTIN No results found for: CHOL, TRIG, HDL, CHOLHDL, VLDL, LDLCALC Lab Results  Component Value Date   TSH 2.76 06/12/2017    Therapeutic Level Labs: No results found for: LITHIUM No results found for: VALPROATE No components found for:  CBMZ  Current Medications: Current Outpatient Medications  Medication Sig Dispense Refill  . amphetamine-dextroamphetamine (ADDERALL XR) 30 MG 24 hr capsule Take 1 capsule (30 mg total) by mouth daily. 30 capsule 0  . busPIRone (BUSPAR) 10 MG tablet Take 2 tablets (20 mg total) by mouth 2 (two) times daily. 120 tablet 2  . etonogestrel (NEXPLANON) 68 MG IMPL implant 1 each by Subdermal route once.    . hydrOXYzine (ATARAX/VISTARIL) 10 MG tablet Take 1 tablet (10 mg total) by mouth 3 (three) times daily as needed for anxiety. 90 tablet 2  . meloxicam (MOBIC) 7.5 MG tablet Take 1 tablet (7.5 mg total) by mouth daily. 90 tablet 0  . traZODone (DESYREL) 50 MG tablet Take one or two each evening 120 tablet 1  . venlafaxine XR (EFFEXOR-XR) 150 MG 24 hr capsule Take 2 capsules (300 mg total) by mouth daily with breakfast. Take 3 each morning 180 capsule 0   No current facility-administered medications for this visit.    Psychiatric Specialty Exam: Review of Systems  Psychiatric/Behavioral: Positive for decreased concentration. The patient is nervous/anxious.   All other systems reviewed and are negative.   There were no vitals taken for this visit.There is no height or weight on file to calculate BMI.  General Appearance: NA  Eye Contact:  NA  Speech:  Clear and Coherent and Normal Rate  Volume:  Normal  Mood:  Some anxiety.  Affect:  NA  Thought Process:  Goal Directed and Linear  Orientation:  Full (Time, Place, and Person)  Thought Content: Logical   Suicidal Thoughts:  No  Homicidal Thoughts:  No   Memory:  Immediate;   Good Recent;   Good Remote;   Good  Judgement:  Good  Insight:  Good  Psychomotor Activity:  NA  Concentration:  Concentration: Fair  Recall:  Good  Fund of Knowledge: Good  Language: Good  Akathisia:  Negative  Handed:  Right  AIMS (if indicated): not done  Assets:  Communication Skills Desire for Improvement Financial Resources/Insurance [place] Talents/Skills  ADL's:  Intact  Cognition: WNL  Sleep:  Good   Screenings: GAD-7     Counselor from 09/09/2017 in [place]  Total GAD-7 Score  19    PHQ2-9     Counselor from 09/09/2017 in [place]  PHQ-2 Total Score  5  PHQ-9 Total Score  23       Assessment and Plan: 18yo single white female who has been previouslyfollowed by Dr. Milana Lopez for MDD/GAD. Adanely reports some anxietywhich comes on and off and appears to reflect her tendency to obsess about various things. She also admits to compulsions at times eg needs to drink water over and over to decrease anxiety but "it does not help much". She has had these problems since she was a child. She reports no depression. She takes hydroxyzine10 mgprn with fair effect but mostly forgets to do it. She has been now on a higher 300 mg dose of Effexor with decrease of anxiety/obsessiveness. Also takes buspirone 20 mg bid and trazodone prn insomnia. All appear towork well. Noadverse effects reported. No hopelessness, no SI reported. She started at [employer] last semester and noticed more problems with focusing, concentration, struggling with assignment completion, procrastination. These problems are not new but seems to become more apparent in a new environment. She has two first cousions diagnosed with ADHD. There is no hyperactivity component described. We added Wellbutrin XL but it did not help with concentration. Adderall XR 20 mg did but she still struggles some.  No adverse effects besides some decrease in appetite described.   Dx: GAD/OCD; MDD in remission; ADD adult;   Plan: Continuetrazodone, buspirone and hydroxyzine, Effexor to 300 mg (for OCD) and increase Adderall XR to 30 mg. Next appointment in two months.The plan was discussed with patient who had an opportunity to ask questions and these were all answered. I spend25 minutes invideoconferencing with the patient.   Magdalene Patricia, MD 04/01/2019, 9:14 AM

## 2019-04-11 ENCOUNTER — Other Ambulatory Visit: Payer: Self-pay

## 2019-04-11 ENCOUNTER — Ambulatory Visit (HOSPITAL_COMMUNITY): Payer: 59 | Admitting: Psychology

## 2019-04-11 ENCOUNTER — Encounter (HOSPITAL_COMMUNITY): Payer: Self-pay | Admitting: Psychology

## 2019-04-11 NOTE — Progress Notes (Signed)
Caroline Lopez is a 19 y.o. female patient who didn't show for her virtual appointment.  Pt was informed by email.        Forde Radon, [facility]

## 2019-04-25 ENCOUNTER — Ambulatory Visit (INDEPENDENT_AMBULATORY_CARE_PROVIDER_SITE_OTHER): Payer: 59 | Admitting: Psychology

## 2019-04-25 ENCOUNTER — Other Ambulatory Visit: Payer: Self-pay

## 2019-04-25 DIAGNOSIS — F330 Major depressive disorder, recurrent, mild: Secondary | ICD-10-CM | POA: Diagnosis not present

## 2019-04-25 DIAGNOSIS — F411 Generalized anxiety disorder: Secondary | ICD-10-CM

## 2019-04-25 NOTE — Progress Notes (Signed)
Virtual Visit via Video Note  I connected with Caroline Lopez on 04/25/19 at  2:30 PM EST by a video enabled telemedicine application and verified that I am speaking with the correct person using two identifiers.   I discussed the limitations of evaluation and management by telemedicine and the availability of in person appointments. The patient expressed understanding and agreed to proceed.    I discussed the assessment and treatment plan with the patient. The patient was provided an opportunity to ask questions and all were answered. The patient agreed with the plan and demonstrated an understanding of the instructions.   The patient was advised to call back or seek an in-person evaluation if the symptoms worsen or if the condition fails to improve as anticipated.  I provided 48 minutes of non-face-to-face time during this encounter.   Forde Radon [facility]    THERAPIST PROGRESS NOTE  Session Time: 2.30pm-3.18pm  Participation Level: Active  Behavioral Response: Well GroomedAlertaffect wnl  Type of Therapy: Individual Therapy  Treatment Goals addressed: Diagnosis: GAD, MDD and goal 1.  Interventions: CBT and Strength-based  Summary: Caroline Lopez is a 19 y.o. female who presents with affect bright.  Pt is at the beach w/ friends- enjoying a break over the weekend and finishing up some school work today before heading back to school tomorrow.  Pt reported that overall she is doing well w/ managing her classes this semester.  Her math class is her struggling and currently a D grade.  Pt reported that it is a self taught class using a program that working through modules and that have only had one test that failed.  Pt acknowledged need to f/u w/ tutoring at this point and has avoided w/ anxiety and discomfort has created.  Pt reported that she is settling into a routine w/her dog who is in training for service dog.  Pt reported that she has been missing some evening doses of meds and  recognized that may be impacting her self care w/ hygiene- lacked motivation for week.  Pt also reported that increased picking on her skin.  Pt receptive to use of reminder app to increase mindfulness and increase awareness of when occurring.  Pt reported that she received a text from her ex last month and when did was shocked, but also made her reflect on her growth of how she has moved on from relationship and was able to think about how wanted to respond w/out being petty and keep boundary of not reengaging in a friendship.  Pt discussed how felt good about her growth and ability to assert herself.   Suicidal/Homicidal: Nowithout intent/plan  Therapist Response: Assessed pt current functioning per pt report. Processed w/pt coping w/ stressors of school and challenging class and ways of not avoiding to use her resources available.  Discussed ways of increasing awareness of when picking to be able to use other alternatives/skills.  Reflected pt growth and ability to set boundary in assertive way w/ past relationship.   Plan: Return again in 4 weeks, via webex.  F/u as scheduled w/ Dr. Hinton Dyer.    Diagnosis: MDD, GAD   Forde Radon [facility] 04/25/2019

## 2019-05-04 ENCOUNTER — Other Ambulatory Visit (HOSPITAL_COMMUNITY): Payer: Self-pay | Admitting: Psychiatry

## 2019-05-04 ENCOUNTER — Telehealth (HOSPITAL_COMMUNITY): Payer: Self-pay | Admitting: *Deleted

## 2019-05-04 MED ORDER — AMPHETAMINE-DEXTROAMPHET ER 30 MG PO CP24: 30.0000 mg | ORAL_CAPSULE | Freq: Every day | ORAL | 0 refills | Status: DC

## 2019-05-04 NOTE — Telephone Encounter (Signed)
Done

## 2019-05-04 NOTE — Telephone Encounter (Signed)
Pt called requesting refill of Adderall XR 30mg  last started on 04/01/19. Pt has an upcoming appointment on 06/03/19. Please review.

## 2019-05-11 ENCOUNTER — Other Ambulatory Visit (HOSPITAL_COMMUNITY): Payer: Self-pay | Admitting: Psychiatry

## 2019-05-11 IMAGING — MR MR HEAD W/O CM
8 series · 48 of 48 positions shown · non-contrast
Comparison: None.

CLINICAL DATA: Non intractable headache.  Head injury April 2017.

EXAM:
MRI HEAD WITHOUT CONTRAST
TECHNIQUE: Multiplanar, multiecho pulse sequences of the brain and surrounding
structures were obtained without intravenous contrast.

[Series 5: T1 · sagittal · 4.0mm · 0.75mm/px · 3 of 29 slices shown (1 of 2)]
[im 1/29]
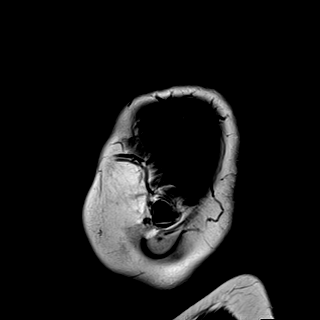
[im 15/29]
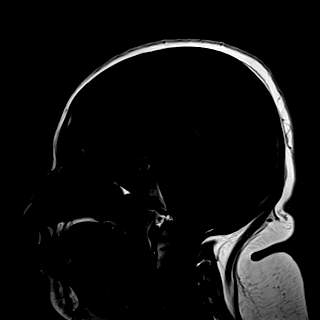
[im 29/29]
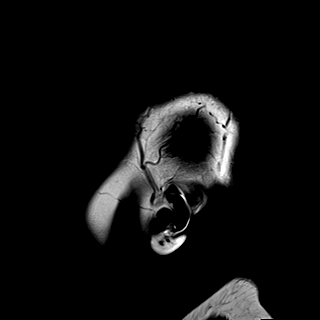

[Series 6: T2 · axial · 4.0mm · 0.36mm/px · z∈[-54,+73]mm · 3 of 26 slices shown (1 of 2)]
[im 1/26]
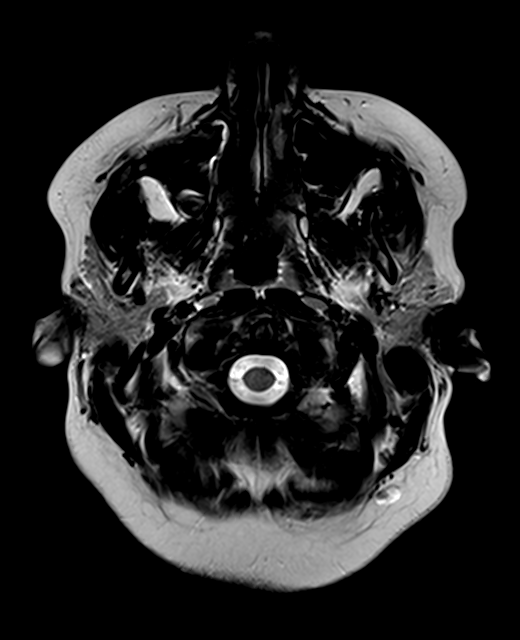
[im 13/26]
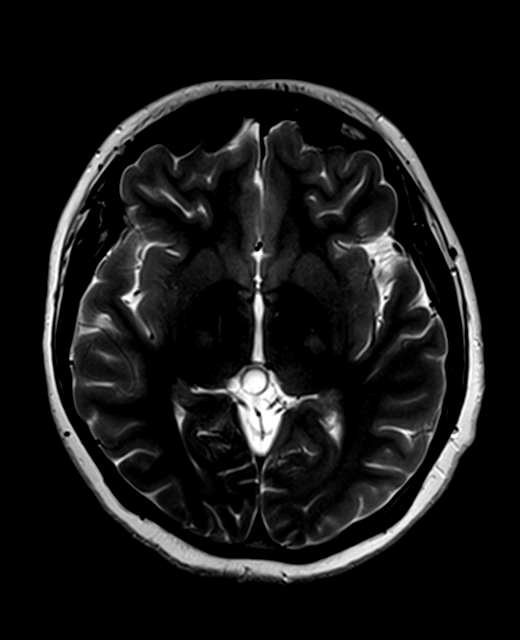
[im 26/26]
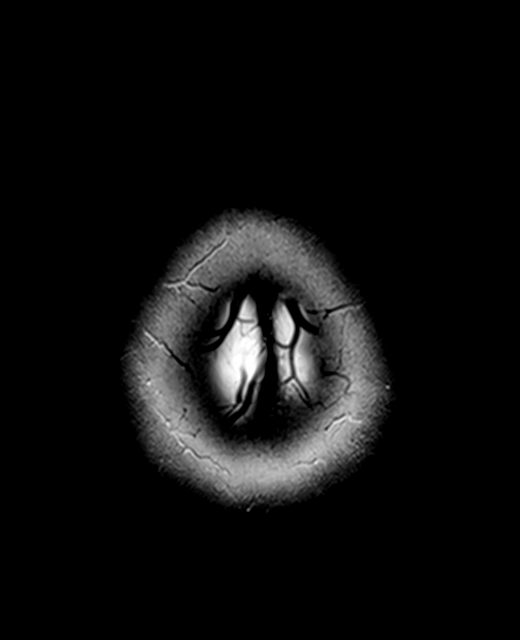

[Series 7: DWI · axial · 3.0mm · 1.44mm/px · z∈[-54,+73]mm · 9 of 80 slices shown (1 of 2)]
[im 1/80]
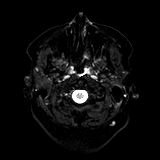
[im 10/80]
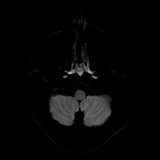
[im 20/80]
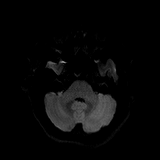
[im 30/80]
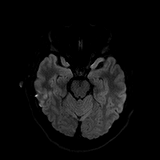
[im 40/80]
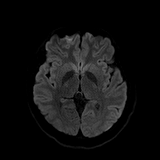
[im 50/80]
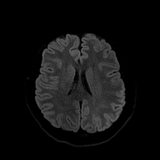
[im 60/80]
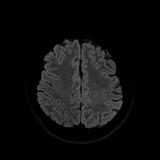
[im 70/80]
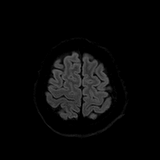
[im 80/80]
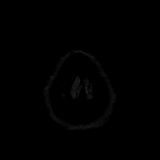

[Series 8: DWI · axial · 3.0mm · 1.44mm/px · z∈[-54,+73]mm · 4 of 40 slices shown (2 of 2)]
[im 1/40]
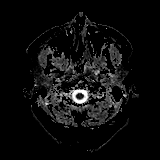
[im 14/40]
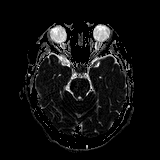
[im 27/40]
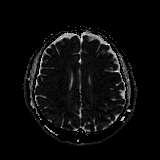
[im 40/40]
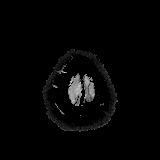

[Series 10: swi_images · axial · 3.0mm · 0.90mm/px · z∈[-60,+79]mm · 5 of 48 slices shown]
[im 1/48]
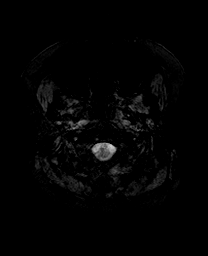
[im 12/48]
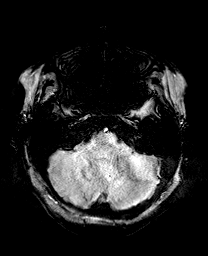
[im 24/48]
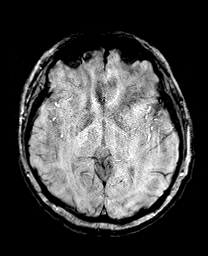
[im 36/48]
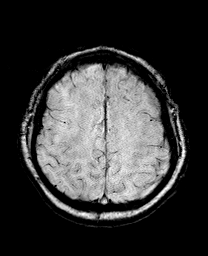
[im 48/48]
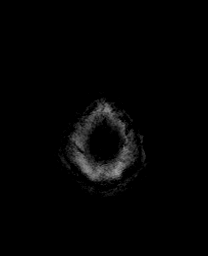

[Series 13: FLAIR · axial · 3.0mm · 0.72mm/px · z∈[-57,+76]mm · 5 of 42 slices shown]
[im 1/42]
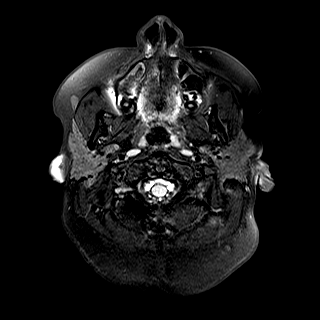
[im 11/42]
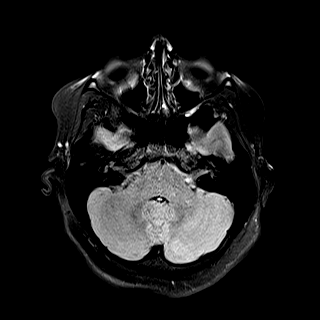
[im 21/42]
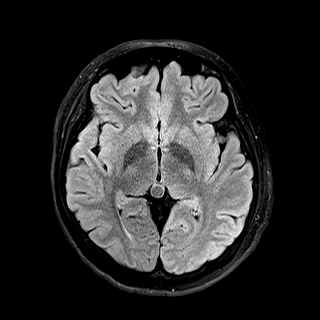
[im 31/42]
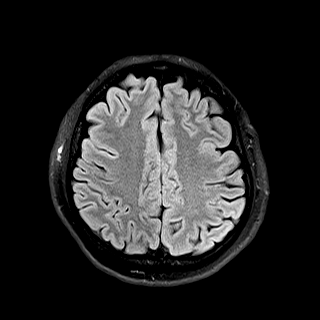
[im 42/42]
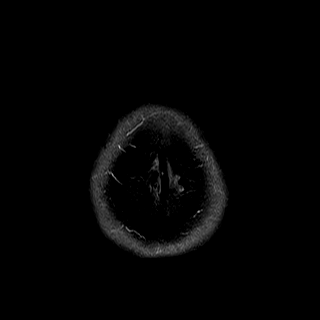

[Series 14: T1 · axial · 1.0mm · 0.90mm/px · z∈[-61,+80]mm · 16 of 144 slices shown (2 of 2)]
[im 1/144]
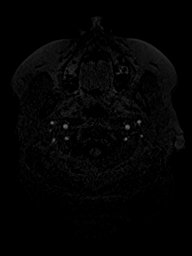
[im 10/144]
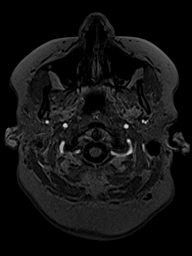
[im 20/144]
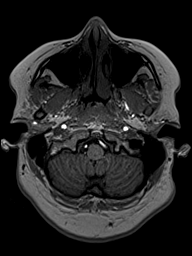
[im 29/144]
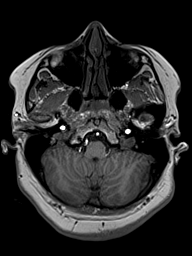
[im 39/144]
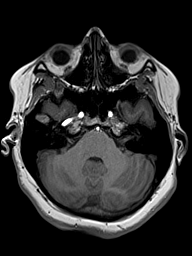
[im 48/144]
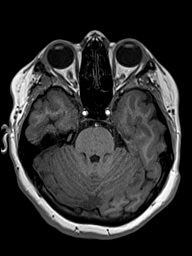
[im 58/144]
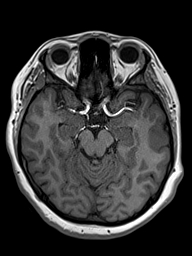
[im 67/144]
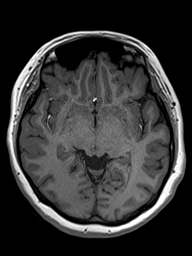
[im 77/144]
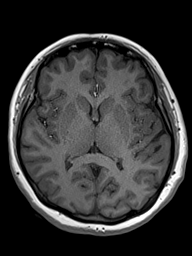
[im 86/144]
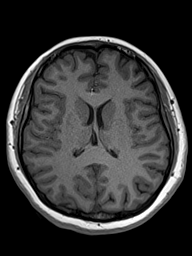
[im 96/144]
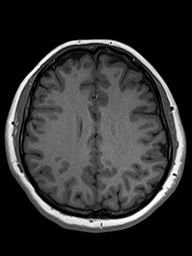
[im 105/144]
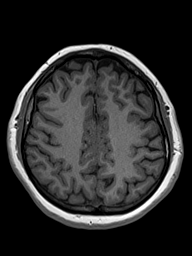
[im 115/144]
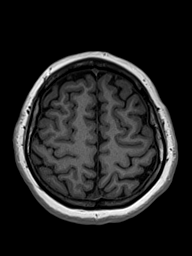
[im 124/144]
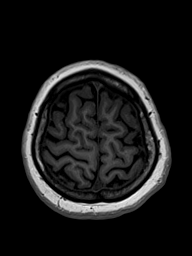
[im 134/144]
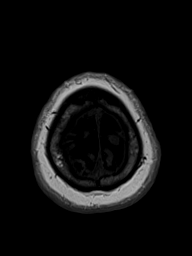
[im 144/144]
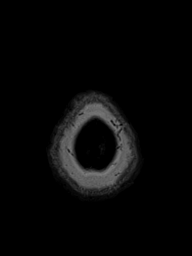

[Series 15: T2 · coronal · 4.5mm · 0.36mm/px · 3 of 28 slices shown (2 of 2)]
[im 1/28]
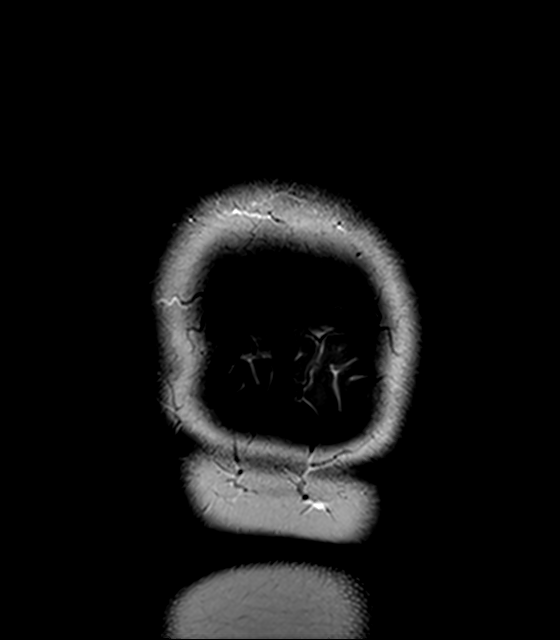
[im 14/28]
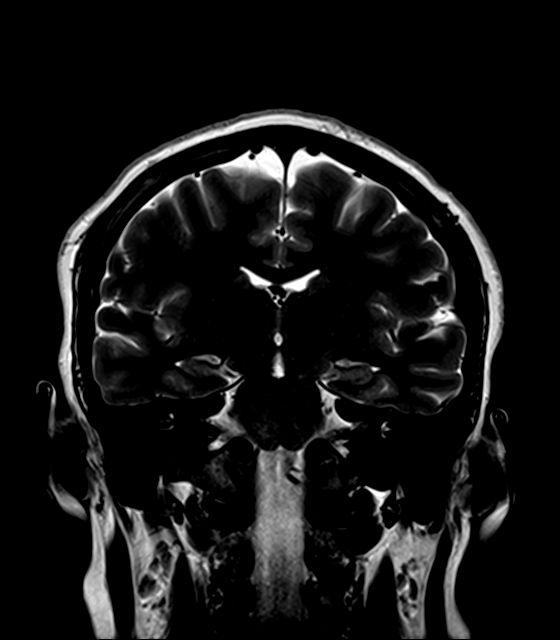
[im 28/28]
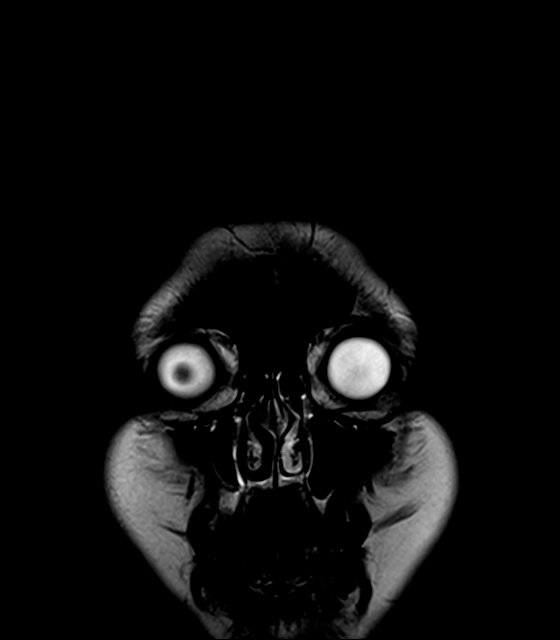

[48 of 48 positions shown; findings below may reference images not displayed]

FINDINGS: Brain: No acute infarction, hemorrhage, hydrocephalus, extra-axial
collection or mass lesion.

Vascular: Normal arterial flow voids

Skull and upper cervical spine: Negative

Sinuses/Orbits: Mild mucosal edema paranasal sinuses.  Normal orbit

Other: None
IMPRESSION: Negative MRI head.

## 2019-05-25 ENCOUNTER — Ambulatory Visit (HOSPITAL_COMMUNITY): Payer: 59 | Admitting: Psychology

## 2019-06-03 ENCOUNTER — Ambulatory Visit (INDEPENDENT_AMBULATORY_CARE_PROVIDER_SITE_OTHER): Payer: 59 | Admitting: Psychiatry

## 2019-06-03 ENCOUNTER — Other Ambulatory Visit: Payer: Self-pay

## 2019-06-03 DIAGNOSIS — F411 Generalized anxiety disorder: Secondary | ICD-10-CM

## 2019-06-03 DIAGNOSIS — F3342 Major depressive disorder, recurrent, in full remission: Secondary | ICD-10-CM | POA: Diagnosis not present

## 2019-06-03 DIAGNOSIS — F909 Attention-deficit hyperactivity disorder, unspecified type: Secondary | ICD-10-CM | POA: Diagnosis not present

## 2019-06-03 DIAGNOSIS — F422 Mixed obsessional thoughts and acts: Secondary | ICD-10-CM | POA: Diagnosis not present

## 2019-06-03 MED ORDER — HYDROXYZINE HCL 25 MG PO TABS: 25.0000 mg | ORAL_TABLET | Freq: Three times a day (TID) | ORAL | 2 refills | Status: DC | PRN

## 2019-06-03 MED ORDER — AMPHETAMINE-DEXTROAMPHET ER 25 MG PO CP24: 50.0000 mg | ORAL_CAPSULE | ORAL | 0 refills | Status: DC

## 2019-06-03 MED ORDER — TRAZODONE HCL 50 MG PO TABS: ORAL_TABLET | ORAL | 1 refills | Status: DC

## 2019-06-03 MED ORDER — AMPHETAMINE-DEXTROAMPHET ER 25 MG PO CP24: 50.0000 mg | ORAL_CAPSULE | Freq: Every day | ORAL | 0 refills | Status: DC

## 2019-06-03 MED ORDER — VENLAFAXINE HCL ER 150 MG PO CP24: 300.0000 mg | ORAL_CAPSULE | Freq: Every day | ORAL | 0 refills | Status: DC

## 2019-06-03 NOTE — Progress Notes (Signed)
BH MD/PA/NP OP Progress Note  06/03/2019 10:17 AM Caroline Lopez  MRN:  [id number] Interview was conducted by phone and I verified that I was speaking with the correct person using two identifiers. I discussed the limitations of evaluation and management by telemedicine and  the availability of in person appointments. Patient expressed understanding and agreed to proceed.  Chief Complaint: Still some problems with concentration.  HPI: 19yo single white female who has been previouslyfollowed by Dr. Milana Kidney for MDD/GAD. [name] reports some anxietywhich comes on and off and appears to reflect her tendency to obsess about various things. She also admits to compulsions at times eg needs to drink water over and over to decrease anxiety but "it does not help much". She has had these problems since she was a child. She reportsno depression. She takes hydroxyzine10 Doctor, general practice but mostly forgets to do it. She has been now on a higher 300 mg dose of Effexor with decrease of anxiety/obsessiveness. Also takes buspirone 20 mg bid and trazodone prn insomnia. All appear towork well. Noadverse effects reported. No hopelessness, no SI reported. She started at [employer] last semester and noticed more problems with focusing, concentration, struggling with assignment completion, procrastination. These problems are not new but seems to become more apparent in a new environment. She has two first cousions diagnosed with ADHD. There is no hyperactivity component described.We added Wellbutrin XL but it did not help with concentration. Adderall XR 30 mg did but she still struggles some.   Visit Diagnosis:    ICD-10-CM   1. Adult ADHD  F90.9   2. Mixed obsessional thoughts and acts  F42.2   3. Major depressive disorder, recurrent episode, in full remission (HCC)  F33.42     Past Psychiatric History: Please see intake H&P.  Past Medical History:  Past Medical History:  Diagnosis Date  . Concussion 04/2017  .  Depression     Past Surgical History:  Procedure Laterality Date  . KNEE SURGERY     2017    Family Psychiatric History: Reviewed.  Family History:  Family History  Problem Relation Age of Onset  . Bipolar disorder Maternal Uncle   . Drug abuse Maternal Uncle   . Suicidality Maternal Uncle   . Suicidality Other        completed suicide  . ADD / ADHD Cousin     Social History:  Social History   Socioeconomic History  . Marital status: Single    Spouse name: Not on file  . Number of children: Not on file  . Years of education: Not on file  . Highest education level: Not on file  Occupational History  . Occupation: Consulting civil engineer  Tobacco Use  . Smoking status: Never Smoker  . Smokeless tobacco: Never Used  Substance and Sexual Activity  . Alcohol use: No  . Drug use: No  . Sexual activity: Yes    Birth control/protection: Implant  Other Topics Concern  . Not on file  Social History Narrative  . Not on file   Social Determinants of Health   Financial Resource Strain:   . Difficulty of Paying Living Expenses:   Food Insecurity:   . Worried About Programme researcher, broadcasting/film/video in the Last Year:   . Barista in the Last Year:   Transportation Needs:   . Freight forwarder (Medical):   [name] Lack of Transportation (Non-Medical):   Physical Activity:   . Days of Exercise per Week:   . Minutes of Exercise  per Session:   Stress:   . Feeling of Stress :   Social Connections:   . Frequency of Communication with Friends and Family:   . Frequency of Social Gatherings with Friends and Family:   . Attends Religious Services:   . Active Member of Clubs or Organizations:   . Attends Banker Meetings:   [name] Marital Status:     Allergies: No Known Allergies  Metabolic Disorder Labs: No results found for: HGBA1C, MPG No results found for: PROLACTIN No results found for: CHOL, TRIG, HDL, CHOLHDL, VLDL, LDLCALC Lab Results  Component Value Date   TSH 2.76  06/12/2017    Therapeutic Level Labs: No results found for: LITHIUM No results found for: VALPROATE No components found for:  CBMZ  Current Medications: Current Outpatient Medications  Medication Sig Dispense Refill  . amphetamine-dextroamphetamine (ADDERALL XR) 25 MG 24 hr capsule Take 2 capsules by mouth daily. 60 capsule 0  . [START ON 08/03/2019] amphetamine-dextroamphetamine (ADDERALL XR) 25 MG 24 hr capsule Take 2 capsules by mouth every morning. 60 capsule 0  . [START ON 07/03/2019] amphetamine-dextroamphetamine (ADDERALL XR) 25 MG 24 hr capsule Take 2 capsules by mouth every morning. 60 capsule 0  . busPIRone (BUSPAR) 10 MG tablet TAKE 2 TABLETS BY MOUTH TWICE DAILY 120 tablet 2  . etonogestrel (NEXPLANON) 68 MG IMPL implant 1 each by Subdermal route once.    . hydrOXYzine (ATARAX/VISTARIL) 25 MG tablet Take 1 tablet (25 mg total) by mouth 3 (three) times daily as needed for anxiety. 90 tablet 2  . meloxicam (MOBIC) 7.5 MG tablet Take 1 tablet (7.5 mg total) by mouth daily. 90 tablet 0  . traZODone (DESYREL) 50 MG tablet Take one or two each evening 120 tablet 1  . venlafaxine XR (EFFEXOR-XR) 150 MG 24 hr capsule Take 2 capsules (300 mg total) by mouth daily with breakfast. Take 3 each morning 180 capsule 0   No current facility-administered medications for this visit.     Psychiatric Specialty Exam: Review of Systems  Psychiatric/Behavioral: Positive for decreased concentration. The patient is nervous/anxious.   All other systems reviewed and are negative.   There were no vitals taken for this visit.There is no height or weight on file to calculate BMI.  General Appearance: NA  Eye Contact:  NA  Speech:  Clear and Coherent and Normal Rate  Volume:  Normal  Mood:  Anxious  Affect:  NA  Thought Process:  Goal Directed and Linear  Orientation:  Full (Time, Place, and Person)  Thought Content: Rumination   Suicidal Thoughts:  No  Homicidal Thoughts:  No  Memory:   Immediate;   Good Recent;   Good Remote;   Good  Judgement:  Good  Insight:  Good  Psychomotor Activity:  NA  Concentration:  Concentration: Fair  Recall:  Good  Fund of Knowledge: Good  Language: Good  Akathisia:  Negative  Handed:  Right  AIMS (if indicated): not done  Assets:  Communication Skills Desire for Improvement Financial Resources/Insurance Housing Physical Health Talents/Skills  ADL's:  Intact  Cognition: WNL  Sleep:  Good   Screenings: GAD-7     Counselor from 09/09/2017 in [facility] [place]  Total GAD-7 Score  19    PHQ2-9     Counselor from 09/09/2017 in [facility] [place]  PHQ-2 Total Score  5  PHQ-9 Total Score  23       Assessment and Plan: 19yo single white female who  has been previouslyfollowed by Dr. Melanee Left for MDD/GAD. Laquinda reports some anxietywhich comes on and off and appears to reflect her tendency to obsess about various things. She also admits to compulsions at times eg needs to drink water over and over to decrease anxiety but "it does not help much". She has had these problems since she was a child. She reportsno depression. She takes hydroxyzine10 Radiation protection practitioner but mostly forgets to do it. She has been now on a higher 300 mg dose of Effexor with decrease of anxiety/obsessiveness. Also takes buspirone 20 mg bid and trazodone prn insomnia. All appear towork well. Noadverse effects reported. No hopelessness, no SI reported. She started at [employer] last semester and noticed more problems with focusing, concentration, struggling with assignment completion, procrastination. These problems are not new but seems to become more apparent in a new environment. She has two first cousions diagnosed with ADHD. There is no hyperactivity component described.We added Wellbutrin XL but it did not help with concentration. Adderall XR 30 mg did but she still struggles some.   Dx: GAD/OCD; MDD in  remission; ADDadult;  Plan: Continuetrazodone, buspirone and hydroxyzine, Effexor to 300 mg (for OCD) and increase Adderall XR to 50 mg (she will try it as a single dose or 25 mg bid).Next appointment in three months.The plan was discussed with patient who had an opportunity to ask questions and these were all answered. I spend20 minutes invideoconferencing with the patient.    Stephanie Acre, MD 06/03/2019, 10:17 AM

## 2019-06-13 ENCOUNTER — Telehealth (HOSPITAL_COMMUNITY): Payer: Self-pay

## 2019-06-13 NOTE — Telephone Encounter (Signed)
Prior authorization approved 06/13/2019 through 06/12/2020. Pharmacy notified of approval.

## 2019-06-13 NOTE — Telephone Encounter (Signed)
Prior authorization sent through CoverMyMeds for Adderall XR. PA Case ID: [id number]. KEY: [id number]

## 2019-06-20 ENCOUNTER — Other Ambulatory Visit: Payer: Self-pay

## 2019-06-20 ENCOUNTER — Encounter (HOSPITAL_COMMUNITY): Payer: Self-pay | Admitting: Psychology

## 2019-06-20 ENCOUNTER — Ambulatory Visit (HOSPITAL_COMMUNITY): Payer: 59 | Admitting: Psychology

## 2019-06-20 NOTE — Progress Notes (Signed)
Caroline Lopez is a 19 y.o. female patient who didn't show for her virtual appointment.  Pt was informed by email and informed of counselor's upcoming leave from practice and need for transition of care. [name]        Forde Radon, [facility]

## 2019-07-05 ENCOUNTER — Ambulatory Visit (INDEPENDENT_AMBULATORY_CARE_PROVIDER_SITE_OTHER): Payer: 59 | Admitting: Psychology

## 2019-07-05 ENCOUNTER — Other Ambulatory Visit: Payer: Self-pay

## 2019-07-05 DIAGNOSIS — F411 Generalized anxiety disorder: Secondary | ICD-10-CM

## 2019-07-05 DIAGNOSIS — F3342 Major depressive disorder, recurrent, in full remission: Secondary | ICD-10-CM

## 2019-07-05 NOTE — Progress Notes (Signed)
Virtual Visit via Video Note  I connected with Fay Bagg on 07/05/19 at 10:00 AM EDT by a video enabled telemedicine application and verified that I am speaking with the correct person using two identifiers.   I discussed the limitations of evaluation and management by telemedicine and the availability of in person appointments. The patient expressed understanding and agreed to proceed.    I discussed the assessment and treatment plan with the patient. The patient was provided an opportunity to ask questions and all were answered. The patient agreed with the plan and demonstrated an understanding of the instructions.   The patient was advised to call back or seek an in-person evaluation if the symptoms worsen or if the condition fails to improve as anticipated.  I provided 33 minutes of non-face-to-face time during this encounter.   [name] [facility]    THERAPIST PROGRESS NOTE  Session Time: 10am-10.33am  Participation Level: Active  Behavioral Response: Well GroomedAlertaffect wnl  Type of Therapy: Individual Therapy  Treatment Goals addressed: Diagnosis: GAD and goal 1.  Interventions: CBT and Strength-based  Summary: Onalee Steinbach is a 19 y.o. female who presents with affect bright.  Pt reported she is back home for summer and ended up doing well this semester and on the [employer].  Pt reported that she is working full time hours as Quarry manager for summer and taking summer session [employer] course through [employer].  Pt reported on challenges through the semester- some academic, other social w/ friend and his mental health challenges.  Pt reported that through that she had support of student health counseling services and will be able to continue through the summer. Pt discussed how she learned need to keep good boundaries and doesn't mean not a good friend.   Pt also identified a lot of growth for self w/ responsibility and how to plan and organize better for self.  Pt  discussed that she feels she has had a lot of growth in her counseling and will be able to continue w/ support of her student health counseling services.  Pt is aware of options if needs for counseling past college services.    Suicidal/Homicidal: Nowithout intent/plan  Therapist Response: Assessed pt current functioning per pt report.  processed w/pt transition home.  Processed w/pt challenges faced this semester and growth through those challenges.  explored w/ pt continued support w/ counseling services through [employer] and discussed her plan for continued tx.   Plan: d/c from counseling w/ current counselor as has met needs and has support through campus services. f/u as scheduled w/ Dr. Montel Culver.  Pt will continue counseling w/ campus student health counseling at this time.  Pt is aware of options if needs to return for counseling in the community.   Diagnosis: MDD, in remission; GAD [name], [facility] 07/05/2019

## 2019-08-18 ENCOUNTER — Other Ambulatory Visit (HOSPITAL_COMMUNITY): Payer: Self-pay | Admitting: Psychiatry

## 2019-08-31 ENCOUNTER — Other Ambulatory Visit: Payer: Self-pay

## 2019-08-31 ENCOUNTER — Other Ambulatory Visit (HOSPITAL_COMMUNITY): Payer: Self-pay | Admitting: *Deleted

## 2019-08-31 ENCOUNTER — Telehealth (INDEPENDENT_AMBULATORY_CARE_PROVIDER_SITE_OTHER): Payer: 59 | Admitting: Psychiatry

## 2019-08-31 DIAGNOSIS — F909 Attention-deficit hyperactivity disorder, unspecified type: Secondary | ICD-10-CM | POA: Diagnosis not present

## 2019-08-31 DIAGNOSIS — F422 Mixed obsessional thoughts and acts: Secondary | ICD-10-CM | POA: Diagnosis not present

## 2019-08-31 DIAGNOSIS — F3342 Major depressive disorder, recurrent, in full remission: Secondary | ICD-10-CM | POA: Diagnosis not present

## 2019-08-31 DIAGNOSIS — F411 Generalized anxiety disorder: Secondary | ICD-10-CM | POA: Diagnosis not present

## 2019-08-31 MED ORDER — AMPHETAMINE-DEXTROAMPHET ER 25 MG PO CP24: 50.0000 mg | ORAL_CAPSULE | ORAL | 0 refills | Status: DC

## 2019-08-31 MED ORDER — VENLAFAXINE HCL ER 150 MG PO CP24: ORAL_CAPSULE | ORAL | 0 refills | Status: DC

## 2019-08-31 MED ORDER — HYDROXYZINE HCL 25 MG PO TABS: 25.0000 mg | ORAL_TABLET | Freq: Three times a day (TID) | ORAL | 2 refills | Status: AC | PRN

## 2019-08-31 MED ORDER — VENLAFAXINE HCL ER 150 MG PO CP24: 300.0000 mg | ORAL_CAPSULE | Freq: Every day | ORAL | 0 refills | Status: DC

## 2019-08-31 MED ORDER — AMPHETAMINE-DEXTROAMPHET ER 25 MG PO CP24: 50.0000 mg | ORAL_CAPSULE | Freq: Every day | ORAL | 0 refills | Status: DC

## 2019-08-31 NOTE — Progress Notes (Signed)
BH MD/PA/NP OP Progress Note  08/31/2019 10:15 AM Caroline Lopez  MRN:  [id number] Interview was conducted by phone and I verified that I was speaking with the correct person using two identifiers. I discussed the limitations of evaluation and management by telemedicine and  the availability of in person appointments. Patient expressed understanding and agreed to proceed. Patient location - home; physician - home office.  Chief Complaint: None.  HPI: 18yo single white female who has been previouslyfollowed by Dr. Milana Lopez for MDD/GAD. Caroline Lopez reports some anxietywhich comes on and off and appears to reflect her tendency to obsess about various things. She also admits to compulsions at times eg needs to drink water over and over to decrease anxiety but "it does nothelp much". She has had these problems since she was a child. She reportsno depression. She takes hydroxyzine10 Doctor, general practice but mostly forgets to do it. She has beennow on a higher 300 mg dose ofEffexorwith decrease of anxiety/obsessiveness. Also takesbuspirone 20 mg bid and trazodone prn insomnia. All appear towork well. Noadverse effects reported. No hopelessness, no SI reported. She started at [employer] last semester and noticed more problems with focusing, concentration, struggling with assignment completion, procrastination. These problems are not new but seems to become more apparent in a new environment. She has two first cousions diagnosed with ADHD. There is no hyperactivity component described.We added Wellbutrin XL but itdid not help with concentration. Adderall XR did and she is now on 50 mg daily dose.   Visit Diagnosis:    ICD-10-CM   1. Adult ADHD  F90.9   2. GAD (generalized anxiety disorder)  F41.1   3. Major depressive disorder, recurrent episode, in full remission (HCC)  F33.42   4. Mixed obsessional thoughts and acts  F42.2     Past Psychiatric History: Please see intake H&P.  Past Medical History:   Past Medical History:  Diagnosis Date  . Concussion 04/2017  . Depression     Past Surgical History:  Procedure Laterality Date  . KNEE SURGERY     2017    Family Psychiatric History: Reviewed.  Family History:  Family History  Problem Relation Age of Onset  . Bipolar disorder Maternal Uncle   . Drug abuse Maternal Uncle   . Suicidality Maternal Uncle   . Suicidality Other        completed suicide  . ADD / ADHD Cousin     Social History:  Social History   Socioeconomic History  . Marital status: Single    Spouse name: Not on file  . Number of children: Not on file  . Years of education: Not on file  . Highest education level: Not on file  Occupational History  . Occupation: Consulting civil engineer  Tobacco Use  . Smoking status: Never Smoker  . Smokeless tobacco: Never Used  Vaping Use  . Vaping Use: Never used  Substance and Sexual Activity  . Alcohol use: No  . Drug use: No  . Sexual activity: Yes    Birth control/protection: Implant  Other Topics Concern  . Not on file  Social History Narrative  . Not on file   Social Determinants of Health   Financial Resource Strain:   . Difficulty of Paying Living Expenses:   Food Insecurity:   . Worried About Programme researcher, broadcasting/film/video in the Last Year:   . Barista in the Last Year:   Transportation Needs:   . Freight forwarder (Medical):   [name] Lack of Transportation (Non-Medical):  Physical Activity:   . Days of Exercise per Week:   . Minutes of Exercise per Session:   Stress:   . Feeling of Stress :   Social Connections:   . Frequency of Communication with Friends and Family:   . Frequency of Social Gatherings with Friends and Family:   . Attends Religious Services:   . Active Member of Clubs or Organizations:   . Attends Banker Meetings:   [name] Marital Status:     Allergies: No Known Allergies  Metabolic Disorder Labs: No results found for: HGBA1C, MPG No results found for: PROLACTIN No  results found for: CHOL, TRIG, HDL, CHOLHDL, VLDL, LDLCALC Lab Results  Component Value Date   TSH 2.76 06/12/2017    Therapeutic Level Labs: No results found for: LITHIUM No results found for: VALPROATE No components found for:  CBMZ  Current Medications: Current Outpatient Medications  Medication Sig Dispense Refill  . [START ON 09/02/2019] amphetamine-dextroamphetamine (ADDERALL XR) 25 MG 24 hr capsule Take 2 capsules by mouth every morning. 60 capsule 0  . [START ON 11/03/2019] amphetamine-dextroamphetamine (ADDERALL XR) 25 MG 24 hr capsule Take 2 capsules by mouth daily. 60 capsule 0  . [START ON 10/03/2019] amphetamine-dextroamphetamine (ADDERALL XR) 25 MG 24 hr capsule Take 2 capsules by mouth every morning. 60 capsule 0  . busPIRone (BUSPAR) 10 MG tablet TAKE 2 TABLETS BY MOUTH TWICE DAILY 120 tablet 2  . etonogestrel (NEXPLANON) 68 MG IMPL implant 1 each by Subdermal route once.    . hydrOXYzine (ATARAX/VISTARIL) 25 MG tablet Take 1 tablet (25 mg total) by mouth 3 (three) times daily as needed for anxiety. 90 tablet 2  . meloxicam (MOBIC) 7.5 MG tablet Take 1 tablet (7.5 mg total) by mouth daily. 90 tablet 0  . traZODone (DESYREL) 50 MG tablet Take one or two each evening 120 tablet 1  . venlafaxine XR (EFFEXOR-XR) 150 MG 24 hr capsule Take 2 capsules (300 mg total) by mouth daily with breakfast. Take 3 each morning 180 capsule 0   No current facility-administered medications for this visit.    Psychiatric Specialty Exam: Review of Systems  All other systems reviewed and are negative.   There were no vitals taken for this visit.There is no height or weight on file to calculate BMI.  General Appearance: NA  Eye Contact:  NA  Speech:  Clear and Coherent and Normal Rate  Volume:  Normal  Mood:  Euthymic  Affect:  NA  Thought Process:  Goal Directed and Linear  Orientation:  Full (Time, Place, and Person)  Thought Content: Logical   Suicidal Thoughts:  No  Homicidal  Thoughts:  No  Memory:  Immediate;   Good Recent;   Good Remote;   Good  Judgement:  Good  Insight:  Good  Psychomotor Activity:  NA  Concentration:  Concentration: Good  Recall:  Good  Fund of Knowledge: Good  Language: Good  Akathisia:  Negative  Handed:  Right  AIMS (if indicated): not done  Assets:  Communication Skills Desire for Improvement Financial Resources/Insurance Housing  ADL's:  Intact  Cognition: WNL  Sleep:  Good   Screenings: GAD-7     Counselor from 09/09/2017 in BEHAVIORAL HEALTH OUTPATIENT THERAPY [place]  Total GAD-7 Score 19    PHQ2-9     Counselor from 09/09/2017 in BEHAVIORAL HEALTH OUTPATIENT THERAPY [place]  PHQ-2 Total Score 5  PHQ-9 Total Score 23       Assessment and Plan: 18yo single white female  with GAD, OCD, MDD (in remission)  and adult ADHD. Carmina reports some anxietywhich comes on and off and appears to reflect her tendency to obsess about various things. She also admits to compulsions at times eg needs to drink water over and over to decrease anxiety but "it does nothelp much". She has had these problems since she was a child. She reportsno depression. She takes hydroxyzine10 Doctor, general practice but mostly forgets to do it. She has beennow on a higher 300 mg dose ofEffexorwith decrease of anxiety/obsessiveness. Also takesbuspirone 20 mg bid and trazodone prn insomnia. All appear towork well. Noadverse effects reported. No hopelessness, no SI reported. She started at [employer] last semester and noticed more problems with focusing, concentration, struggling with assignment completion, procrastination. These problems are not new but seems to become more apparent in a new environment. She has two first cousions diagnosed with ADHD. There is no hyperactivity component described.We added Wellbutrin XL but itdid not help with concentration. Adderall XR did and she is now on 50 mg daily dose.   Dx: GAD/OCD; MDD in remission;  ADDadult;  Plan: Continuetrazodone, buspirone and hydroxyzine, Effexor to 300 mg (for OCD) andAdderall XR  50 mg.Next appointment inthreemonths.The plan was discussed with patient who had an opportunity to ask questions and these were all answered. I spend15 minutes inphone consultation  with the patient.    Magdalene Patricia, MD 08/31/2019, 10:15 AM

## 2019-09-24 ENCOUNTER — Other Ambulatory Visit (HOSPITAL_COMMUNITY): Payer: Self-pay | Admitting: Psychiatry

## 2019-11-01 ENCOUNTER — Other Ambulatory Visit (HOSPITAL_COMMUNITY): Payer: Self-pay | Admitting: Psychiatry

## 2019-11-16 ENCOUNTER — Ambulatory Visit (INDEPENDENT_AMBULATORY_CARE_PROVIDER_SITE_OTHER): Payer: 59 | Admitting: Psychiatry

## 2019-11-16 ENCOUNTER — Other Ambulatory Visit: Payer: Self-pay

## 2019-11-16 DIAGNOSIS — F411 Generalized anxiety disorder: Secondary | ICD-10-CM

## 2019-11-16 DIAGNOSIS — F909 Attention-deficit hyperactivity disorder, unspecified type: Secondary | ICD-10-CM | POA: Diagnosis not present

## 2019-11-16 DIAGNOSIS — F422 Mixed obsessional thoughts and acts: Secondary | ICD-10-CM | POA: Diagnosis not present

## 2019-11-16 DIAGNOSIS — F3342 Major depressive disorder, recurrent, in full remission: Secondary | ICD-10-CM

## 2019-11-16 MED ORDER — FLUOXETINE HCL 20 MG PO CAPS: ORAL_CAPSULE | ORAL | 0 refills | Status: DC

## 2019-11-16 MED ORDER — AMPHETAMINE-DEXTROAMPHET ER 25 MG PO CP24: 50.0000 mg | ORAL_CAPSULE | ORAL | 0 refills | Status: DC

## 2019-11-16 MED ORDER — BUSPIRONE HCL 10 MG PO TABS: 20.0000 mg | ORAL_TABLET | Freq: Two times a day (BID) | ORAL | 2 refills | Status: DC

## 2019-11-16 MED ORDER — TRAZODONE HCL 50 MG PO TABS: ORAL_TABLET | ORAL | 1 refills | Status: DC

## 2019-11-16 NOTE — Progress Notes (Signed)
BH MD/PA/NP OP Progress Note  11/16/2019 1:56 PM Caroline Lopez  MRN:  [id number] Interview was conducted by phone and I verified that I was speaking with the correct person using two identifiers. I discussed the limitations of evaluation and management by telemedicine and  the availability of in person appointments. Patient expressed understanding and agreed to proceed. Patient location - home; physician - home office.  Chief Complaint: Lack of energy, motivation since BCP was added.  HPI: 19yo single white female with GAD, OCD, MDD (in remission)  and adult ADHD. Ferrin reports some anxietywhich comes on and off and appears to reflect her tendency to obsess about various things. She also admits to compulsions at times eg needs to drink water over and over to decrease anxiety but "it does nothelp much". She has had these problems since she was a child. She reportsno depression. She takes hydroxyzine10 Doctor, general practice but mostly forgets to do it. She has beennow on a higher 300 mg dose ofEffexorwith decrease of anxiety/obsessiveness. Also takesbuspirone 20 mg bid and trazodone prn insomnia. All appear towork well. Noadverse effects reported. No hopelessness, no SI reported. She started at [employer] last semester and noticed more problems with focusing, concentration, struggling with assignment completion, procrastination. These problems are not new but seems to become more apparent in a new environment. She has two first cousions diagnosed with ADHD. There is no hyperactivity component described.We added Wellbutrin XL but itdid not help with concentration. Adderall XRdid and she is now on 50 mg daily dose. She is overweight and it is possible that venlafaxine has been contributing to that. She al;so reports loss of drive/motivation since BCP (Yaz) was added.   Visit Diagnosis:    ICD-10-CM   1. GAD (generalized anxiety disorder)  F41.1   2. Adult ADHD  F90.9   3. Major depressive  disorder, recurrent episode, in full remission (HCC)  F33.42   4. Mixed obsessional thoughts and acts  F42.2     Past Psychiatric History: Please see intake H&P.  Past Medical History:  Past Medical History:  Diagnosis Date  . Concussion 04/2017  . Depression     Past Surgical History:  Procedure Laterality Date  . KNEE SURGERY     2017    Family Psychiatric History: Reviewed.  Family History:  Family History  Problem Relation Age of Onset  . Bipolar disorder Maternal Uncle   . Drug abuse Maternal Uncle   . Suicidality Maternal Uncle   . Suicidality Other        completed suicide  . ADD / ADHD Cousin     Social History:  Social History   Socioeconomic History  . Marital status: Single    Spouse name: Not on file  . Number of children: Not on file  . Years of education: Not on file  . Highest education level: Not on file  Occupational History  . Occupation: Consulting civil engineer  Tobacco Use  . Smoking status: Never Smoker  . Smokeless tobacco: Never Used  Vaping Use  . Vaping Use: Never used  Substance and Sexual Activity  . Alcohol use: No  . Drug use: No  . Sexual activity: Yes    Birth control/protection: Implant  Other Topics Concern  . Not on file  Social History Narrative  . Not on file   Social Determinants of Health   Financial Resource Strain:   . Difficulty of Paying Living Expenses: Not on file  Food Insecurity:   . Worried About [employer] of  Food in the Last Year: Not on file  . Ran Out of Food in the Last Year: Not on file  Transportation Needs:   . Lack of Transportation (Medical): Not on file  . Lack of Transportation (Non-Medical): Not on file  Physical Activity:   . Days of Exercise per Week: Not on file  . Minutes of Exercise per Session: Not on file  Stress:   . Feeling of Stress : Not on file  Social Connections:   . Frequency of Communication with Friends and Family: Not on file  . Frequency of Social Gatherings with Friends and  Family: Not on file  . Attends Religious Services: Not on file  . Active Member of Clubs or Organizations: Not on file  . Attends Banker Meetings: Not on file  . Marital Status: Not on file    Allergies: No Known Allergies  Metabolic Disorder Labs: No results found for: HGBA1C, MPG No results found for: PROLACTIN No results found for: CHOL, TRIG, HDL, CHOLHDL, VLDL, LDLCALC Lab Results  Component Value Date   TSH 2.76 06/12/2017    Therapeutic Level Labs: No results found for: LITHIUM No results found for: VALPROATE No components found for:  CBMZ  Current Medications: Current Outpatient Medications  Medication Sig Dispense Refill  . amphetamine-dextroamphetamine (ADDERALL XR) 25 MG 24 hr capsule Take 2 capsules by mouth daily. 60 capsule 0  . [START ON 12/03/2019] amphetamine-dextroamphetamine (ADDERALL XR) 25 MG 24 hr capsule Take 2 capsules by mouth every morning. 60 capsule 0  . [START ON 01/03/2020] amphetamine-dextroamphetamine (ADDERALL XR) 25 MG 24 hr capsule Take 2 capsules by mouth every morning. 60 capsule 0  . busPIRone (BUSPAR) 10 MG tablet Take 2 tablets (20 mg total) by mouth 2 (two) times daily. 120 tablet 2  . etonogestrel (NEXPLANON) 68 MG IMPL implant 1 each by Subdermal route once.    [name] FLUoxetine (PROZAC) 20 MG capsule Take 1 capsule (20 mg total) by mouth daily for 14 days, THEN 1 capsule (20 mg total) daily. 74 capsule 0  . hydrOXYzine (ATARAX/VISTARIL) 25 MG tablet Take 1 tablet (25 mg total) by mouth 3 (three) times daily as needed for anxiety. 90 tablet 2  . meloxicam (MOBIC) 7.5 MG tablet Take 1 tablet (7.5 mg total) by mouth daily. 90 tablet 0  . traZODone (DESYREL) 50 MG tablet Take one or two each evening 120 tablet 1   No current facility-administered medications for this visit.      Psychiatric Specialty Exam: Review of Systems  Constitutional: Positive for fatigue.  Psychiatric/Behavioral: Positive for decreased concentration.  The patient is nervous/anxious.   All other systems reviewed and are negative.   There were no vitals taken for this visit.There is no height or weight on file to calculate BMI.  General Appearance: NA  Eye Contact:  NA  Speech:  Clear and Coherent and Normal Rate  Volume:  Normal  Mood:  Anxious  Affect:  NA  Thought Process:  Goal Directed  Orientation:  Full  Thought Content: Obsessions and Rumination   Suicidal Thoughts:  No  Homicidal Thoughts:  No  Memory:  Immediate;   Fair Recent;   Fair Remote;   Good  Judgement:  Good  Insight:  Good  Psychomotor Activity:  NA  Concentration:  Concentration: Fair  Recall:  Fair  Fund of Knowledge: Good  Language: Good  Akathisia:  Negative  Handed:  Right  AIMS (if indicated): not done  Assets:  Communication Skills  Desire for Improvement Financial Resources/Insurance Housing Social Support Talents/Skills  ADL's:  Intact  Cognition: WNL  Sleep:  Good   Screenings: GAD-7     Counselor from 09/09/2017 in BEHAVIORAL HEALTH OUTPATIENT THERAPY [place]  Total GAD-7 Score 19    PHQ2-9     Counselor from 09/09/2017 in BEHAVIORAL HEALTH OUTPATIENT THERAPY [place]  PHQ-2 Total Score 5  PHQ-9 Total Score 23       Assessment and Plan: 19yo single white female with GAD, OCD, MDD (in remission)  and adult ADHD. Meline reports some anxietywhich comes on and off and appears to reflect her tendency to obsess about various things. She also admits to compulsions at times eg needs to drink water over and over to decrease anxiety but "it does nothelp much". She has had these problems since she was a child. She reportsno depression. She takes hydroxyzine10 Doctor, general practice but mostly forgets to do it. She has beennow on a higher 300 mg dose ofEffexorwith decrease of anxiety/obsessiveness. Also takesbuspirone 20 mg bid and trazodone prn insomnia. All appear towork well. Noadverse effects reported. No hopelessness, no SI  reported. She started at [employer] last semester and noticed more problems with focusing, concentration, struggling with assignment completion, procrastination. These problems are not new but seems to become more apparent in a new environment. She has two first cousions diagnosed with ADHD. There is no hyperactivity component described.We added Wellbutrin XL but itdid not help with concentration. Adderall XRdid and she is now on 50 mg daily dose. She is overweight and it is possible that venlafaxine has been contributing to that. She al;so reports loss of drive/motivation since BCP (Yaz) was added.  Dx: GAD/OCD; MDD in remission; ADDadult;  Plan: Continuetrazodone, buspirone and hydroxyzine andAdderall XR 50 mg.WE will switch from Effexor to Prozac - gradually. Next appointment intwomonths.The plan was discussed with patient who had an opportunity to ask questions and these were all answered. I spend36minutes inphone consultation  with the patient.    Magdalene Patricia, MD 11/16/2019, 1:56 PM

## 2019-11-17 ENCOUNTER — Telehealth (HOSPITAL_COMMUNITY): Payer: Self-pay

## 2019-11-17 NOTE — Telephone Encounter (Signed)
Pharmacy called for clarification on patient's sig for her Fluoxetine 20mg . It states "Take 1 capsule 20mg  total by mouth for 14 days, the 1 capsule 20mg  total daily." Did you want it BID then QD or something else? Please advise. Thank you.

## 2019-11-18 ENCOUNTER — Other Ambulatory Visit (HOSPITAL_COMMUNITY): Payer: Self-pay | Admitting: Psychiatry

## 2019-11-18 MED ORDER — FLUOXETINE HCL 20 MG PO CAPS: ORAL_CAPSULE | ORAL | 0 refills | Status: DC

## 2019-11-18 NOTE — Telephone Encounter (Signed)
I made a mistake - she should take one capsule daily for 2 weeks then two daily - I already corrected the prescription.

## 2019-11-18 NOTE — Telephone Encounter (Signed)
Thanks Dr P

## 2019-12-02 ENCOUNTER — Other Ambulatory Visit (HOSPITAL_COMMUNITY): Payer: Self-pay | Admitting: Psychiatry

## 2020-01-10 ENCOUNTER — Other Ambulatory Visit: Payer: Self-pay

## 2020-01-10 ENCOUNTER — Telehealth (INDEPENDENT_AMBULATORY_CARE_PROVIDER_SITE_OTHER): Payer: 59 | Admitting: Psychiatry

## 2020-01-10 DIAGNOSIS — F909 Attention-deficit hyperactivity disorder, unspecified type: Secondary | ICD-10-CM | POA: Diagnosis not present

## 2020-01-10 DIAGNOSIS — F411 Generalized anxiety disorder: Secondary | ICD-10-CM

## 2020-01-10 DIAGNOSIS — F331 Major depressive disorder, recurrent, moderate: Secondary | ICD-10-CM | POA: Diagnosis not present

## 2020-01-10 DIAGNOSIS — F422 Mixed obsessional thoughts and acts: Secondary | ICD-10-CM | POA: Diagnosis not present

## 2020-01-10 MED ORDER — BUSPIRONE HCL 10 MG PO TABS
20.0000 mg | ORAL_TABLET | Freq: Two times a day (BID) | ORAL | 2 refills | Status: DC
Start: 2020-02-03 — End: 2020-05-31

## 2020-01-10 MED ORDER — HYDROXYZINE PAMOATE 25 MG PO CAPS: 25.0000 mg | ORAL_CAPSULE | Freq: Three times a day (TID) | ORAL | 2 refills | Status: AC | PRN

## 2020-01-10 MED ORDER — AMPHETAMINE-DEXTROAMPHET ER 25 MG PO CP24
50.0000 mg | ORAL_CAPSULE | ORAL | 0 refills | Status: DC
Start: 2020-02-03 — End: 2020-03-16

## 2020-01-10 MED ORDER — AMPHETAMINE-DEXTROAMPHET ER 25 MG PO CP24
50.0000 mg | ORAL_CAPSULE | Freq: Every day | ORAL | 0 refills | Status: DC
Start: 2020-03-05 — End: 2020-07-20

## 2020-01-10 MED ORDER — FLUOXETINE HCL 40 MG PO CAPS: 40.0000 mg | ORAL_CAPSULE | Freq: Every day | ORAL | 0 refills | Status: DC

## 2020-01-10 NOTE — Progress Notes (Signed)
BH MD/PA/NP OP Progress Note  01/10/2020 9:55 AM Caroline Lopez  MRN:  [id number] Interview was conducted by phone and I verified that I was speaking with the correct person using two identifiers. I discussed the limitations of evaluation and management by telemedicine and  the availability of in person appointments. Patient expressed understanding and agreed to proceed. Patient location - home; physician - home office.  Chief Complaint: Anxiety, depression, mood swings.  HPI: 18yo single white female with GAD, OCD, MDD (in remission) and adult ADHD. Caroline Lopez reports some anxietywhich comes on and off and appears to reflect her tendency to obsess about various things. She also admits to compulsions at times eg needs to drink water over and over to decrease anxiety but "it does nothelp much". She has had these problems since she was a child. She reportsno depression. She takes hydroxyzine10 Doctor, general practice but mostly forgets to do it. She has beennow on a higher 300 mg dose ofEffexorwith decrease of anxiety/obsessiveness. Also takesbuspirone 20 mg bid and trazodone prn insomnia. All appear towork well. Noadverse effects reported. No hopelessness, no SI reported. She started at [employer] last semester and noticed more problems with focusing, concentration, struggling with assignment completion, procrastination. These problems are not new but seems to become more apparent in a new environment. She has two first cousions diagnosed with ADHD. There is no hyperactivity component described.We added Wellbutrin XL but itdid not help with concentration. Adderall XRdidand she is now on 50 mg daily dose.She is overweight and it is possible that venlafaxine has been contributing to that. We have switched to fluoxetine 40 mg which she tolerates well. In October she became more depressed, "moody" and passively suicidal - following advice from her therapist she started PHP at [facility] which she will  complete tomorrow and start IOP. There is a possibility of her having bipolar spectrum disorder and she may be started on a mood stabilizer - they have not decided yet. Her mood has improved some, she no longer reports feeling suicidal but had to drop few classes because of her mental health issues..   Visit Diagnosis:    ICD-10-CM   1. Major depressive disorder, recurrent episode, moderate (HCC)  F33.1   2. GAD (generalized anxiety disorder)  F41.1   3. Adult ADHD  F90.9   4. Mixed obsessional thoughts and acts  F42.2     Past Psychiatric History: Please see intake H&P.  Past Medical History:  Past Medical History:  Diagnosis Date  . Concussion 04/2017  . Depression     Past Surgical History:  Procedure Laterality Date  . KNEE SURGERY     2017    Family Psychiatric History: Reviewed.  Family History:  Family History  Problem Relation Age of Onset  . Bipolar disorder Maternal Uncle   . Drug abuse Maternal Uncle   . Suicidality Maternal Uncle   . Suicidality Other        completed suicide  . ADD / ADHD Cousin     Social History:  Social History   Socioeconomic History  . Marital status: Single    Spouse name: Not on file  . Number of children: Not on file  . Years of education: Not on file  . Highest education level: Not on file  Occupational History  . Occupation: Consulting civil engineer  Tobacco Use  . Smoking status: Never Smoker  . Smokeless tobacco: Never Used  Vaping Use  . Vaping Use: Never used  Substance and Sexual Activity  . Alcohol  use: No  . Drug use: No  . Sexual activity: Yes    Birth control/protection: Implant  Other Topics Concern  . Not on file  Social History Narrative  . Not on file   Social Determinants of Health   Financial Resource Strain:   . Difficulty of Paying Living Expenses: Not on file  Food Insecurity:   . Worried About Programme researcher, broadcasting/film/video in the Last Year: Not on file  . Ran Out of Food in the Last Year: Not on file   Transportation Needs:   . Lack of Transportation (Medical): Not on file  . Lack of Transportation (Non-Medical): Not on file  Physical Activity:   . Days of Exercise per Week: Not on file  . Minutes of Exercise per Session: Not on file  Stress:   . Feeling of Stress : Not on file  Social Connections:   . Frequency of Communication with Friends and Family: Not on file  . Frequency of Social Gatherings with Friends and Family: Not on file  . Attends Religious Services: Not on file  . Active Member of Clubs or Organizations: Not on file  . Attends Banker Meetings: Not on file  . Marital Status: Not on file    Allergies: No Known Allergies  Metabolic Disorder Labs: No results found for: HGBA1C, MPG No results found for: PROLACTIN No results found for: CHOL, TRIG, HDL, CHOLHDL, VLDL, LDLCALC Lab Results  Component Value Date   TSH 2.76 06/12/2017    Therapeutic Level Labs: No results found for: LITHIUM No results found for: VALPROATE No components found for:  CBMZ  Current Medications: Current Outpatient Medications  Medication Sig Dispense Refill  . amphetamine-dextroamphetamine (ADDERALL XR) 25 MG 24 hr capsule Take 2 capsules by mouth every morning. 60 capsule 0  . [START ON 03/05/2020] amphetamine-dextroamphetamine (ADDERALL XR) 25 MG 24 hr capsule Take 2 capsules by mouth daily. 60 capsule 0  . [START ON 02/03/2020] amphetamine-dextroamphetamine (ADDERALL XR) 25 MG 24 hr capsule Take 2 capsules by mouth every morning. 60 capsule 0  . [START ON 02/03/2020] busPIRone (BUSPAR) 10 MG tablet Take 2 tablets (20 mg total) by mouth 2 (two) times daily. 120 tablet 2  . etonogestrel (NEXPLANON) 68 MG IMPL implant 1 each by Subdermal route once.    [name] FLUoxetine (PROZAC) 40 MG capsule Take 1 capsule (40 mg total) by mouth daily. 90 capsule 0  . hydrOXYzine (VISTARIL) 25 MG capsule Take 1 capsule (25 mg total) by mouth 3 (three) times daily as needed for anxiety. 90  capsule 2  . meloxicam (MOBIC) 7.5 MG tablet Take 1 tablet (7.5 mg total) by mouth daily. 90 tablet 0  . traZODone (DESYREL) 50 MG tablet Take one or two each evening 120 tablet 1   No current facility-administered medications for this visit.      Psychiatric Specialty Exam: Review of Systems  Psychiatric/Behavioral: The patient is nervous/anxious.   All other systems reviewed and are negative.   There were no vitals taken for this visit.There is no height or weight on file to calculate BMI.  General Appearance: NA  Eye Contact:  NA  Speech:  Clear and Coherent and Normal Rate  Volume:  Normal  Mood:  Anxious and Depressed  Affect:  NA  Thought Process:  Goal Directed and Linear  Orientation:  Full (Time, Place, and Person)  Thought Content: Logical   Suicidal Thoughts:  No  Homicidal Thoughts:  No  Memory:  Immediate;  Good Recent;   Good Remote;   Good  Judgement:  Good  Insight:  Good  Psychomotor Activity:  NA  Concentration:  Concentration: Good  Recall:  Good  Fund of Knowledge: Good  Language: Good  Akathisia:  Negative  Handed:  Right  AIMS (if indicated): not done  Assets:  Communication Skills Desire for Improvement Financial Resources/Insurance  ADL's:  Intact  Cognition: WNL  Sleep:  Fair   Screenings: GAD-7     Counselor from 09/09/2017 in BEHAVIORAL HEALTH OUTPATIENT THERAPY [place]  Total GAD-7 Score 19    PHQ2-9     Counselor from 09/09/2017 in BEHAVIORAL HEALTH OUTPATIENT THERAPY [place]  PHQ-2 Total Score 5  PHQ-9 Total Score 23       Assessment and Plan: 18yo single white female with GAD, OCD, MDD (in remission) and adult ADHD. Audra reports some anxietywhich comes on and off and appears to reflect her tendency to obsess about various things. She also admits to compulsions at times eg needs to drink water over and over to decrease anxiety but "it does nothelp much". She has had these problems since she was a child. She reportsno  depression. She takes hydroxyzine10 Doctor, general practice but mostly forgets to do it. She has beennow on a higher 300 mg dose ofEffexorwith decrease of anxiety/obsessiveness. Also takesbuspirone 20 mg bid and trazodone prn insomnia. All appear towork well. Noadverse effects reported. No hopelessness, no SI reported. She started at [employer] last semester and noticed more problems with focusing, concentration, struggling with assignment completion, procrastination. These problems are not new but seems to become more apparent in a new environment. She has two first cousions diagnosed with ADHD. There is no hyperactivity component described.We added Wellbutrin XL but itdid not help with concentration. Adderall XRdidand she is now on 50 mg daily dose.She is overweight and it is possible that venlafaxine has been contributing to that. We have switched to fluoxetine 40 mg which she tolerates well. In October she became more depressed, "moody" and passively suicidal - following advice from her therapist she started PHP at [facility] which she will complete tomorrow and start IOP. There is a possibility of her having bipolar spectrum disorder and she may be started on a mood stabilizer - they have not decided yet. Her mood has improved some, she no longer reports feeling suicidal but had to drop few classes because of her mental health issues..  Dx: MDD moderate; GAD/OCD; ADDadult;r/o bipolar spectrum disorder  Plan: Continuefluoxetine 40 mg, trazodone, buspirone, hydroxyzine andAdderall XR 50 mg.Next appointment in5 weeks.The plan was discussed with patient who had an opportunity to ask questions and these were all answered. I spend69minutes inphone consultationwith the patient.   Magdalene Patricia, MD 01/10/2020, 9:55 AM

## 2020-02-15 ENCOUNTER — Telehealth (INDEPENDENT_AMBULATORY_CARE_PROVIDER_SITE_OTHER): Payer: 59 | Admitting: Psychiatry

## 2020-02-15 ENCOUNTER — Other Ambulatory Visit: Payer: Self-pay

## 2020-02-15 DIAGNOSIS — F909 Attention-deficit hyperactivity disorder, unspecified type: Secondary | ICD-10-CM | POA: Diagnosis not present

## 2020-02-15 DIAGNOSIS — F422 Mixed obsessional thoughts and acts: Secondary | ICD-10-CM | POA: Diagnosis not present

## 2020-02-15 DIAGNOSIS — F3181 Bipolar II disorder: Secondary | ICD-10-CM

## 2020-02-15 DIAGNOSIS — F411 Generalized anxiety disorder: Secondary | ICD-10-CM

## 2020-02-15 MED ORDER — LAMOTRIGINE 25 MG PO TABS: ORAL_TABLET | ORAL | 0 refills | Status: DC

## 2020-02-15 NOTE — Progress Notes (Signed)
BH MD/PA/NP OP Progress Note  02/15/2020 9:16 AM Caroline Lopez  MRN:  [id number] Interview was conducted by phone and I verified that I was speaking with the correct person using two identifiers. I discussed the limitations of evaluation and management by telemedicine and  the availability of in person appointments. Patient expressed understanding and agreed to proceed. Participants in the visit: patient (location - home); physician (location - home office).  Chief Complaint: Mood still up and down.  HPI: 19yo single white female with GAD, OCD, MDDand adult ADHD. Angila reports some anxietywhich comes on and off and appears to reflect her tendency to obsess about various things. She also admits to compulsions at times eg needs to drink water over and over to decrease anxiety but "it does nothelp much". She has had these problems since she was a child. She reportsno depression. She takes hydroxyzine10 Doctor, general practice but mostly forgets to do it. She has beennow on a higher 300 mg dose ofEffexorwith decrease of anxiety/obsessiveness. Also takesbuspirone 20 mg bid and trazodone prn insomnia. All appear towork well. Noadverse effects reported. No hopelessness, no SI reported. She started at [employer] last semester and noticed more problems with focusing, concentration, struggling with assignment completion, procrastination. These problems are not new but seems to become more apparent in a new environment. She has two first cousions diagnosed with ADHD. There is no hyperactivity component described.We added Wellbutrin XL but itdid not help with concentration. Adderall XRdidand she is now on 50 mg daily dose.She is overweight and it is possible that venlafaxine has been contributing to that. We have switched to fluoxetine 40 mg which she tolerates well. In October she became more depressed, "moody" and passively suicidal - following advice from her therapist she started [facility]  and now moved to IOP which she will complete in two days. There is a strong possibility of her having bipolar spectrum disorder but they decided to wait for [place] to meet rather than start her on a mood stabilizer while in IOP. Her mood has improved some but still fluctuates; she no longer reports feeling suicidal but had to drop few classes because of her mental health issues. She has been started on a low 25 mg dose of  topiramate and fentermine for weight loss.   Visit Diagnosis:    ICD-10-CM   1. Bipolar 2 disorder (HCC)  F31.81   2. Adult ADHD  F90.9   3. GAD (generalized anxiety disorder)  F41.1   4. Mixed obsessional thoughts and acts  F42.2     Past Psychiatric History: Please see intake H&P.  Past Medical History:  Past Medical History:  Diagnosis Date  . Concussion 04/2017  . Depression     Past Surgical History:  Procedure Laterality Date  . KNEE SURGERY     2017    Family Psychiatric History: Reviewed.  Family History:  Family History  Problem Relation Age of Onset  . Bipolar disorder Maternal Uncle   . Drug abuse Maternal Uncle   . Suicidality Maternal Uncle   . Suicidality Other        completed suicide  . ADD / ADHD Cousin     Social History:  Social History   Socioeconomic History  . Marital status: Single    Spouse name: Not on file  . Number of children: Not on file  . Years of education: Not on file  . Highest education level: Not on file  Occupational History  . Occupation: Consulting civil engineer  Tobacco Use  . Smoking status: Never Smoker  . Smokeless tobacco: Never Used  Vaping Use  . Vaping Use: Never used  Substance and Sexual Activity  . Alcohol use: No  . Drug use: No  . Sexual activity: Yes    Birth control/protection: Implant  Other Topics Concern  . Not on file  Social History Narrative  . Not on file   Social Determinants of Health   Financial Resource Strain: Not on file  Food Insecurity: Not on file  Transportation Needs: Not on  file  Physical Activity: Not on file  Stress: Not on file  Social Connections: Not on file    Allergies: No Known Allergies  Metabolic Disorder Labs: No results found for: HGBA1C, MPG No results found for: PROLACTIN No results found for: CHOL, TRIG, HDL, CHOLHDL, VLDL, LDLCALC Lab Results  Component Value Date   TSH 2.76 06/12/2017    Therapeutic Level Labs: No results found for: LITHIUM No results found for: VALPROATE No components found for:  CBMZ  Current Medications: Current Outpatient Medications  Medication Sig Dispense Refill  . lamoTRIgine (LAMICTAL) 25 MG tablet Take 1 tablet (25 mg total) by mouth at bedtime for 15 days, THEN 2 tablets (50 mg total) at bedtime for 15 days. 45 tablet 0  . amphetamine-dextroamphetamine (ADDERALL XR) 25 MG 24 hr capsule Take 2 capsules by mouth every morning. 60 capsule 0  . [START ON 03/05/2020] amphetamine-dextroamphetamine (ADDERALL XR) 25 MG 24 hr capsule Take 2 capsules by mouth daily. 60 capsule 0  . amphetamine-dextroamphetamine (ADDERALL XR) 25 MG 24 hr capsule Take 2 capsules by mouth every morning. 60 capsule 0  . busPIRone (BUSPAR) 10 MG tablet Take 2 tablets (20 mg total) by mouth 2 (two) times daily. 120 tablet 2  . etonogestrel (NEXPLANON) 68 MG IMPL implant 1 each by Subdermal route once.    [name] FLUoxetine (PROZAC) 40 MG capsule Take 1 capsule (40 mg total) by mouth daily. 90 capsule 0  . hydrOXYzine (VISTARIL) 25 MG capsule Take 1 capsule (25 mg total) by mouth 3 (three) times daily as needed for anxiety. 90 capsule 2  . meloxicam (MOBIC) 7.5 MG tablet Take 1 tablet (7.5 mg total) by mouth daily. 90 tablet 0  . traZODone (DESYREL) 50 MG tablet Take one or two each evening 120 tablet 1   No current facility-administered medications for this visit.      Psychiatric Specialty Exam: Review of Systems  Psychiatric/Behavioral: The patient is nervous/anxious.   All other systems reviewed and are negative.   There were no  vitals taken for this visit.There is no height or weight on file to calculate BMI.  General Appearance: NA  Eye Contact:  NA  Speech:  Clear and Coherent and Normal Rate  Volume:  Normal  Mood:  Anxious and Irritable  Affect:  NA  Thought Process:  Goal Directed and Linear  Orientation:  Full (Time, Place, and Person)  Thought Content: Rumination   Suicidal Thoughts:  No  Homicidal Thoughts:  No  Memory:  Immediate;   Good Recent;   Good Remote;   Good  Judgement:  Good  Insight:  Good  Psychomotor Activity:  NA  Concentration:  Concentration: Good  Recall:  Good  Fund of Knowledge: Good  Language: Good  Akathisia:  Negative  Handed:  Right  AIMS (if indicated): not done  Assets:  Communication Skills Desire for Improvement Financial Resources/Insurance Housing Social Support Talents/Skills  ADL's:  Intact  Cognition: WNL  Sleep:  Good   Screenings: GAD-7   Flowsheet Row Counselor from 09/09/2017 in BEHAVIORAL HEALTH OUTPATIENT THERAPY [place]  Total GAD-7 Score 19    PHQ2-9   Flowsheet Row Counselor from 09/09/2017 in BEHAVIORAL HEALTH OUTPATIENT THERAPY [place]  PHQ-2 Total Score 5  PHQ-9 Total Score 23       Assessment and Plan: 19yo single white female with GAD, OCD, MDDand adult ADHD. Kora reports some anxietywhich comes on and off and appears to reflect her tendency to obsess about various things. She also admits to compulsions at times eg needs to drink water over and over to decrease anxiety but "it does nothelp much". She has had these problems since she was a child. She reportsno depression. She takes hydroxyzine10 Doctor, general practice but mostly forgets to do it. She has beennow on a higher 300 mg dose ofEffexorwith decrease of anxiety/obsessiveness. Also takesbuspirone 20 mg bid and trazodone prn insomnia. All appear towork well. Noadverse effects reported. No hopelessness, no SI reported. She started at [employer] last semester and noticed more  problems with focusing, concentration, struggling with assignment completion, procrastination. These problems are not new but seems to become more apparent in a new environment. She has two first cousions diagnosed with ADHD. There is no hyperactivity component described.We added Wellbutrin XL but itdid not help with concentration. Adderall XRdidand she is now on 50 mg daily dose.She is overweight and it is possible that venlafaxine has been contributing to that. We have switched to fluoxetine 40 mg which she tolerates well. In October she became more depressed, "moody" and passively suicidal - following advice from her therapist she started [facility] and now moved to IOP which she will complete in two days. There is a strong possibility of her having bipolar spectrum disorder but they decided to wait for [place] to meet rather than start her on a mood stabilizer while in IOP. Her mood has improved some but still fluctuates; she no longer reports feeling suicidal but had to drop few classes because of her mental health issues. She has been started on a low 25 mg dose of  topiramate and fentermine for weight loss.  Dx: Bipolar 2 disorder, rapid cycling; GAD/OCD; ADDadult;r/o bipolar spectrum disorder  Plan: We will try adding Lamictal per usual titration schedule for mood stabilization. Continuefluoxetine 40 mg, trazodone, buspirone, hydroxyzine andAdderall XR 50 mg.Next appointment in4 weeks.The plan was discussed with patient who had an opportunity to ask questions and these were all answered. I spend36minutes inphone consultationwith the patient.   Magdalene Patricia, MD 02/15/2020, 9:16 AM

## 2020-03-16 ENCOUNTER — Telehealth (INDEPENDENT_AMBULATORY_CARE_PROVIDER_SITE_OTHER): Payer: 59 | Admitting: Psychiatry

## 2020-03-16 ENCOUNTER — Other Ambulatory Visit: Payer: Self-pay

## 2020-03-16 DIAGNOSIS — F411 Generalized anxiety disorder: Secondary | ICD-10-CM

## 2020-03-16 DIAGNOSIS — F422 Mixed obsessional thoughts and acts: Secondary | ICD-10-CM | POA: Diagnosis not present

## 2020-03-16 DIAGNOSIS — F909 Attention-deficit hyperactivity disorder, unspecified type: Secondary | ICD-10-CM

## 2020-03-16 DIAGNOSIS — F3181 Bipolar II disorder: Secondary | ICD-10-CM | POA: Diagnosis not present

## 2020-03-16 MED ORDER — AMPHETAMINE-DEXTROAMPHET ER 25 MG PO CP24
50.0000 mg | ORAL_CAPSULE | ORAL | 0 refills | Status: DC
Start: 2020-04-04 — End: 2020-07-17

## 2020-03-16 MED ORDER — LAMOTRIGINE 200 MG PO TABS: 200.0000 mg | ORAL_TABLET | Freq: Every evening | ORAL | 0 refills | Status: DC

## 2020-03-16 MED ORDER — AMPHETAMINE-DEXTROAMPHET ER 25 MG PO CP24
50.0000 mg | ORAL_CAPSULE | ORAL | 0 refills | Status: DC
Start: 2020-05-02 — End: 2020-07-17

## 2020-03-16 MED ORDER — DESVENLAFAXINE SUCCINATE ER 50 MG PO TB24: 50.0000 mg | ORAL_TABLET | Freq: Every day | ORAL | 1 refills | Status: DC

## 2020-03-16 NOTE — Progress Notes (Signed)
BH MD/PA/NP OP Progress Note  03/16/2020 10:18 AM Caroline Lopez  MRN:  [id number] Interview was conducted by phone and I verified that I was speaking with the correct person using two identifiers. I discussed the limitations of evaluation and management by telemedicine and  the availability of in person appointments. Patient expressed understanding and agreed to proceed. Participants in the visit: patient (location - home); physician (location - home office).  Chief Complaint: Mood swings.  HPI: 19 single white female with GAD, OCD, MDDand adult ADHD. Caroline Lopez reports some anxietywhich comes on and off and appears to reflect her tendency to obsess about various things. She also admits to compulsions at times eg needs to drink water over and over to decrease anxiety but "it does nothelp much". She has had these problems since she was a child. She reportsno depression. She takes hydroxyzine10 Doctor, general practice but mostly forgets to do it. She has beennow on a higher 300 mg dose ofEffexorwith decrease of anxiety/obsessiveness. Also takesbuspirone 20 mg bid and trazodone prn insomnia. All appear towork well. Noadverse effects reported. No hopelessness, no SI reported. She started at [employer] last semester and noticed more problems with focusing, concentration, struggling with assignment completion, procrastination. These problems are not new but seems to become more apparent in a new environment. She has two first cousions diagnosed with ADHD. There is no hyperactivity component described.We added Wellbutrin XL but itdid not help with concentration. Adderall XRdidand she is now on 50 mg daily dose.She is overweight and it is possible that venlafaxine has been contributing to that.Wehave switched to fluoxetine 40 mg which she tolerates well. In October she became more depressed, "moody" and passively suicidal -following advice from her therapistshe started PHP at [facility] and now moved to  IOP which she will complete in two days. There is a strong possibility of her having bipolar spectrum disorder but they decided to wait for [place] to meet rather than start her on a mood stabilizer while in IOP. Her mood has improved some but still fluctuates; she no longer reports feeling suicidal but had to drop few classes because of her mental health issues. She is trying to loose weight but did not tolerate topiramate and fentermine prescribed by her OBGYN for weight loss. We have started Lamictal titration for mood stabilization and she tolerates it well. Caroline Lopez has asked if she can go back to venlafaxine as she found it more effective for anxiety than fluoxetine.   Visit Diagnosis:    ICD-10-CM   1. Bipolar 2 disorder (HCC)  F31.81   2. Adult ADHD  F90.9   3. GAD (generalized anxiety disorder)  F41.1   4. Mixed obsessional thoughts and acts  F42.2     Past Psychiatric History: Please ee intake H&P.  Past Medical History:  Past Medical History:  Diagnosis Date  . Concussion 04/2017  . Depression     Past Surgical History:  Procedure Laterality Date  . KNEE SURGERY     2017    Family Psychiatric History: Reviewed.  Family History:  Family History  Problem Relation Age of Onset  . Bipolar disorder Maternal Uncle   . Drug abuse Maternal Uncle   . Suicidality Maternal Uncle   . Suicidality Other        completed suicide  . ADD / ADHD Cousin     Social History:  Social History   Socioeconomic History  . Marital status: Single    Spouse name: Not on file  . Number of  children: Not on file  . Years of education: Not on file  . Highest education level: Not on file  Occupational History  . Occupation: Consulting civil engineer  Tobacco Use  . Smoking status: Never Smoker  . Smokeless tobacco: Never Used  Vaping Use  . Vaping Use: Never used  Substance and Sexual Activity  . Alcohol use: No  . Drug use: No  . Sexual activity: Yes    Birth control/protection: Implant  Other Topics  Concern  . Not on file  Social History Narrative  . Not on file   Social Determinants of Health   Financial Resource Strain: Not on file  Food Insecurity: Not on file  Transportation Needs: Not on file  Physical Activity: Not on file  Stress: Not on file  Social Connections: Not on file    Allergies: No Known Allergies  Metabolic Disorder Labs: No results found for: HGBA1C, MPG No results found for: PROLACTIN No results found for: CHOL, TRIG, HDL, CHOLHDL, VLDL, LDLCALC Lab Results  Component Value Date   TSH 2.76 06/12/2017    Therapeutic Level Labs: No results found for: LITHIUM No results found for: VALPROATE No components found for:  CBMZ  Current Medications: Current Outpatient Medications  Medication Sig Dispense Refill  . desvenlafaxine (PRISTIQ) 50 MG 24 hr tablet Take 1 tablet (50 mg total) by mouth daily. 30 tablet 1  . amphetamine-dextroamphetamine (ADDERALL XR) 25 MG 24 hr capsule Take 2 capsules by mouth daily. 60 capsule 0  . [START ON 05/02/2020] amphetamine-dextroamphetamine (ADDERALL XR) 25 MG 24 hr capsule Take 2 capsules by mouth every morning. 60 capsule 0  . [START ON 04/04/2020] amphetamine-dextroamphetamine (ADDERALL XR) 25 MG 24 hr capsule Take 2 capsules by mouth every morning. 60 capsule 0  . busPIRone (BUSPAR) 10 MG tablet Take 2 tablets (20 mg total) by mouth 2 (two) times daily. 120 tablet 2  . etonogestrel (NEXPLANON) 68 MG IMPL implant 1 each by Subdermal route once.    . hydrOXYzine (VISTARIL) 25 MG capsule Take 1 capsule (25 mg total) by mouth 3 (three) times daily as needed for anxiety. 90 capsule 2  . lamoTRIgine (LAMICTAL) 200 MG tablet Take 1 tablet (200 mg total) by mouth at bedtime. 90 tablet 0  . meloxicam (MOBIC) 7.5 MG tablet Take 1 tablet (7.5 mg total) by mouth daily. 90 tablet 0  . traZODone (DESYREL) 50 MG tablet Take one or two each evening 120 tablet 1   No current facility-administered medications for this visit.      Psychiatric Specialty Exam: Review of Systems  Psychiatric/Behavioral: The patient is nervous/anxious.   All other systems reviewed and are negative.   There were no vitals taken for this visit.There is no height or weight on file to calculate BMI.  General Appearance: NA  Eye Contact:  NA  Speech:  Clear and Coherent and Normal Rate  Volume:  Normal  Mood:  Anxious and Irritable  Affect:  NA  Thought Process:  Goal Directed  Orientation:  Full (Time, Place, and Person)  Thought Content: Logical   Suicidal Thoughts:  No  Homicidal Thoughts:  No  Memory:  Immediate;   Good Recent;   Good Remote;   Good  Judgement:  Good  Insight:  Good  Psychomotor Activity:  NA  Concentration:  Concentration: Good  Recall:  Good  Fund of Knowledge: Good  Language: Good  Akathisia:  Negative  Handed:  Right  AIMS (if indicated): not done  Assets:  Communication Skills  Desire for Improvement Financial Resources/Insurance Housing Social Support Vocational/Educational  ADL's:  Intact  Cognition: WNL  Sleep:  Good   Screenings: GAD-7   Flowsheet Row Counselor from 09/09/2017 in [facility] [place]  Total GAD-7 Score 19    PHQ2-9   Flowsheet Row Counselor from 09/09/2017 in BE[facility] [place]  PHQ-2 Total Score 5  PHQ-9 Total Score 23       Assessment and Plan: 19 single white female with GAD, OCD, MDDand adult ADHD. Cearra reports some anxietywhich comes on and off and appears to reflect her tendency to obsess about various things. She also admits to compulsions at times eg needs to drink water over and over to decrease anxiety but "it does nothelp much". She has had these problems since she was a child. She reportsno depression. She takes hydroxyzine10 Doctor, general practice but mostly forgets to do it. She has beennow on a higher 300 mg dose ofEffexorwith decrease of anxiety/obsessiveness. Also takesbuspirone 20  mg bid and trazodone prn insomnia. All appear towork well. Noadverse effects reported. No hopelessness, no SI reported. She started at [employer] last semester and noticed more problems with focusing, concentration, struggling with assignment completion, procrastination. These problems are not new but seems to become more apparent in a new environment. She has two first cousions diagnosed with ADHD. There is no hyperactivity component described.We added Wellbutrin XL but itdid not help with concentration. Adderall XRdidand she is now on 50 mg daily dose.She is overweight and it is possible that venlafaxine has been contributing to that.Wehave switched to fluoxetine 40 mg which she tolerates well. In October she became more depressed, "moody" and passively suicidal -following advice from her therapistshe started PHP at [facility] and now moved to IOP which she will complete in two days. There is a strong possibility of her having bipolar spectrum disorder but they decided to wait for [place] to meet rather than start her on a mood stabilizer while in IOP. Her mood has improved some but still fluctuates; she no longer reports feeling suicidal but had to drop few classes because of her mental health issues. She is trying to loose weight but did not tolerate topiramate and fentermine prescribed by her OBGYN for weight loss. We have started Lamictal titration for mood stabilization and she tolerates it well. Caroline Lopez has asked if she can go back to venlafaxine as she found it more effective for anxiety than fluoxetine.  CW:CBJSEGB 2 disorder, rapid cycling;GAD/OCD; ADDadult  Plan: We will increase Lamictal to 100 mg x 8 days then 200 mg at HS. We will taper fluoxetine off (one capsule every other day for 8 days then stop) and instead start desvenlafaxine 50 mg daily. Continuetrazodone, buspirone,hydroxyzine andAdderall XR 50 mg.Next appointment in5 weeks.The plan was discussed with patient who had an  opportunity to ask questions and these were all answered. I spend80minutes inphone consultationwith the patient.   Magdalene Patricia, MD 03/16/2020, 10:18 AM

## 2020-04-23 ENCOUNTER — Other Ambulatory Visit: Payer: Self-pay

## 2020-04-23 ENCOUNTER — Telehealth (INDEPENDENT_AMBULATORY_CARE_PROVIDER_SITE_OTHER): Payer: 59 | Admitting: Psychiatry

## 2020-04-23 DIAGNOSIS — F410 Panic disorder [episodic paroxysmal anxiety] without agoraphobia: Secondary | ICD-10-CM

## 2020-04-23 DIAGNOSIS — F3181 Bipolar II disorder: Secondary | ICD-10-CM

## 2020-04-23 DIAGNOSIS — F422 Mixed obsessional thoughts and acts: Secondary | ICD-10-CM

## 2020-04-23 DIAGNOSIS — F411 Generalized anxiety disorder: Secondary | ICD-10-CM

## 2020-04-23 DIAGNOSIS — F909 Attention-deficit hyperactivity disorder, unspecified type: Secondary | ICD-10-CM

## 2020-04-23 MED ORDER — TRAZODONE HCL 50 MG PO TABS: ORAL_TABLET | ORAL | 1 refills | Status: AC

## 2020-04-23 MED ORDER — LORAZEPAM 1 MG PO TABS: 1.0000 mg | ORAL_TABLET | Freq: Every day | ORAL | 1 refills | Status: AC | PRN

## 2020-04-23 MED ORDER — HYDROXYZINE HCL 25 MG PO TABS: 25.0000 mg | ORAL_TABLET | Freq: Two times a day (BID) | ORAL | 2 refills | Status: AC | PRN

## 2020-04-23 MED ORDER — DESVENLAFAXINE SUCCINATE ER 100 MG PO TB24: 100.0000 mg | ORAL_TABLET | Freq: Every day | ORAL | 2 refills | Status: DC

## 2020-04-23 NOTE — Progress Notes (Signed)
BH MD/PA/NP OP Progress Note  04/23/2020 9:52 AM Caroline Lopez  MRN:  [id number] Interview was conducted by phone and I verified that I was speaking with the correct person using two identifiers. I discussed the limitations of evaluation and management by telemedicine and  the availability of in person appointments. Patient expressed understanding and agreed to proceed. Participants in the visit: patient (location - home); physician (location - home office).  Chief Complaint: Obsessing, panic .  HPI: 19 single white female with GAD, OCD, MDDand adult ADHD. Caroline Lopez reports some anxietywhich comes on and off and appears to reflect her tendency to obsess about various things. She also admits to compulsions at times eg needs to drink water over and over to decrease anxiety but "it does nothelp much". She has had these problems since she was a child. She reportsno depression. She takes hydroxyzine10 Doctor, general practice but mostly forgets to do it. She has beennow on a higher 300 mg dose ofEffexorwith decrease of anxiety/obsessiveness. Also takesbuspirone 20 mg bid and trazodone prn insomnia. All appear towork well. Noadverse effects reported. No hopelessness, no SI reported. She started at [employer] last semester and noticed more problems with focusing, concentration, struggling with assignment completion, procrastination. These problems are not new but seems to become more apparent in a new environment. She has two first cousions diagnosed with ADHD. There is no hyperactivity component described.We added Wellbutrin XL but itdid not help with concentration. Adderall XRdidand she is now on 50 mg daily dose.She is overweight and it is possible that venlafaxine has been contributing to that.Wehave switched to fluoxetine 40 mg which she tolerates well. In October she became more depressed, "moody" and passively suicidal -following advice from her therapistshe started PHP [facility] now  moved toIOPwhich she will complete in two days. There is astrongpossibility of her having bipolar spectrum disorderbut they decided to wait for [place] to meet rather than start her on a mood stabilizer while in IOP.Her mood has improved some but still fluctuates;she no longer reports feeling suicidal but had to drop few classes because of her mental health issues. She is trying to loose weight but did not tolerate topiramate and fentermine prescribed by her OBGYN for weight loss. We have started Lamictal titration for mood stabilization and she tolerates it well. Caroline Lopez has asked if she can go back to venlafaxine as she found it more effective for anxiety than fluoxetine.We have started Pristiq 50 mg but so far her tendency to obsess has not diminished. She also had few panic attacks which were not helped by hydroxyzine prn (needed lorazepam in ED).   Visit Diagnosis:    ICD-10-CM   1. Mixed obsessional thoughts and acts  F42.2   2. Bipolar 2 disorder (HCC)  F31.81   3. Adult ADHD  F90.9   4. Panic disorder  F41.0   5. GAD (generalized anxiety disorder)  F41.1     Past Psychiatric History: Please see intake H&P.  Past Medical History:  Past Medical History:  Diagnosis Date  . Concussion 04/2017  . Depression     Past Surgical History:  Procedure Laterality Date  . KNEE SURGERY     2017    Family Psychiatric History: Reviewed.  Family History:  Family History  Problem Relation Age of Onset  . Bipolar disorder Maternal Uncle   . Drug abuse Maternal Uncle   . Suicidality Maternal Uncle   . Suicidality Other        completed suicide  . ADD /  ADHD Cousin     Social History:  Social History   Socioeconomic History  . Marital status: Single    Spouse name: Not on file  . Number of children: Not on file  . Years of education: Not on file  . Highest education level: Not on file  Occupational History  . Occupation: Consulting civil engineer  Tobacco Use  . Smoking status: Never Smoker  .  Smokeless tobacco: Never Used  Vaping Use  . Vaping Use: Never used  Substance and Sexual Activity  . Alcohol use: No  . Drug use: No  . Sexual activity: Yes    Birth control/protection: Implant  Other Topics Concern  . Not on file  Social History Narrative  . Not on file   Social Determinants of Health   Financial Resource Strain: Not on file  Food Insecurity: Not on file  Transportation Needs: Not on file  Physical Activity: Not on file  Stress: Not on file  Social Connections: Not on file    Allergies: No Known Allergies  Metabolic Disorder Labs: No results found for: HGBA1C, MPG No results found for: PROLACTIN No results found for: CHOL, TRIG, HDL, CHOLHDL, VLDL, LDLCALC Lab Results  Component Value Date   TSH 2.76 06/12/2017    Therapeutic Level Labs: No results found for: LITHIUM No results found for: VALPROATE No components found for:  CBMZ  Current Medications: Current Outpatient Medications  Medication Sig Dispense Refill  . LORazepam (ATIVAN) 1 MG tablet Take 1 tablet (1 mg total) by mouth daily as needed for anxiety. 30 tablet 1  . amphetamine-dextroamphetamine (ADDERALL XR) 25 MG 24 hr capsule Take 2 capsules by mouth daily. 60 capsule 0  . [START ON 05/02/2020] amphetamine-dextroamphetamine (ADDERALL XR) 25 MG 24 hr capsule Take 2 capsules by mouth every morning. 60 capsule 0  . amphetamine-dextroamphetamine (ADDERALL XR) 25 MG 24 hr capsule Take 2 capsules by mouth every morning. 60 capsule 0  . busPIRone (BUSPAR) 10 MG tablet Take 2 tablets (20 mg total) by mouth 2 (two) times daily. 120 tablet 2  . desvenlafaxine (PRISTIQ) 100 MG 24 hr tablet Take 1 tablet (100 mg total) by mouth daily. 30 tablet 2  . etonogestrel (NEXPLANON) 68 MG IMPL implant 1 each by Subdermal route once.    . hydrOXYzine (ATARAX/VISTARIL) 25 MG tablet Take 1 tablet (25 mg total) by mouth 2 (two) times daily as needed for anxiety. 60 tablet 2  . lamoTRIgine (LAMICTAL) 200 MG tablet  Take 1 tablet (200 mg total) by mouth at bedtime. 90 tablet 0  . meloxicam (MOBIC) 7.5 MG tablet Take 1 tablet (7.5 mg total) by mouth daily. 90 tablet 0  . traZODone (DESYREL) 50 MG tablet Take one or two each evening 120 tablet 1   No current facility-administered medications for this visit.     Psychiatric Specialty Exam: Review of Systems  Cardiovascular: Positive for palpitations.  Psychiatric/Behavioral: The patient is nervous/anxious.   All other systems reviewed and are negative.   There were no vitals taken for this visit.There is no height or weight on file to calculate BMI.  General Appearance: NA  Eye Contact:  NA  Speech:  Clear and Coherent and Normal Rate  Volume:  Normal  Mood:  Anxious  Affect:  NA  Thought Process:  Goal Directed  Orientation:  Full (Time, Place, and Person)  Thought Content: Obsessions and Rumination   Suicidal Thoughts:  No  Homicidal Thoughts:  No  Memory:  Immediate;   Good  Recent;   Good Remote;   Good  Judgement:  Good  Insight:  Fair  Psychomotor Activity:  NA  Concentration:  Concentration: Good  Recall:  Good  Fund of Knowledge: Good  Language: Good  Akathisia:  Negative  Handed:  Right  AIMS (if indicated): not done  Assets:  Communication Skills Desire for Improvement Financial Resources/Insurance Housing Social Support Vocational/Educational  ADL's:  Intact  Cognition: WNL  Sleep:  Good   Screenings: GAD-7   Advertising copywriter from 09/09/2017 in BE[facility]ERAP[facility] [place]  Total GAD-7 Score 19    PHQ2-9   Flowsheet Row Counselor from 09/09/2017 in BE[facility]AVIORAL HEALTH OUTPATIENT THERAPY [place]  PHQ-2 Total Score 5  PHQ-9 Total Score 23       Assessment and Plan:  19 single white female with GAD, OCD, MDDand adult ADHD. Sheronda reports some anxietywhich comes on and off and appears to reflect her tendency to obsess about various things. She also admits to compulsions at times eg needs  to drink water over and over to decrease anxiety but "it does nothelp much". She has had these problems since she was a child. She reportsno depression. She takes hydroxyzine10 Doctor, general practice but mostly forgets to do it. She has beennow on a higher 300 mg dose ofEffexorwith decrease of anxiety/obsessiveness. Also takesbuspirone 20 mg bid and trazodone prn insomnia. All appear towork well. Noadverse effects reported. No hopelessness, no SI reported. She started at [employer] last semester and noticed more problems with focusing, concentration, struggling with assignment completion, procrastination. These problems are not new but seems to become more apparent in a new environment. She has two first cousions diagnosed with ADHD. There is no hyperactivity component described.We added Wellbutrin XL but itdid not help with concentration. Adderall XRdidand she is now on 50 mg daily dose.She is overweight and it is possible that venlafaxine has been contributing to that.Wehave switched to fluoxetine 40 mg which she tolerates well. In October she became more depressed, "moody" and passively suicidal -following advice from her therapistshe started PHP [facility] now moved toIOPwhich she will complete in two days. There is astrongpossibility of her having bipolar spectrum disorderbut they decided to wait for [place] to meet rather than start her on a mood stabilizer while in IOP.Her mood has improved some but still fluctuates;she no longer reports feeling suicidal but had to drop few classes because of her mental health issues. She is trying to loose weight but did not tolerate topiramate and fentermine prescribed by her OBGYN for weight loss. We have started Lamictal titration for mood stabilization and she tolerates it well. Kayleen has asked if she can go back to venlafaxine as she found it more effective for anxiety than fluoxetine.We have started Pristiq 50 mg but so far her tendency to  obsess has not diminished. She also had few panic attacks which were not helped by hydroxyzine prn (needed lorazepam in ED).  AT:FTDDUKG 2 disorder, rapid cycling;GAD/OCD/Panic disorder; ADDadult  Plan:We will continue Lamictal 200 mg at HS, increase desvenlafaxine to 100 mg daily, continuetrazodone, buspirone,hydroxyzine andAdderall XR 50 mg.I will add lorzepam 1 mg prn severe panic attacks. Next appointment in5weeks.The plan was discussed with patient who had an opportunity to ask questions and these were all answered. I spend69minutes inphone consultationwith the patient.    Magdalene Patricia, MD 04/23/2020, 9:52 AM

## 2020-05-31 ENCOUNTER — Telehealth (INDEPENDENT_AMBULATORY_CARE_PROVIDER_SITE_OTHER): Payer: 59 | Admitting: Psychiatry

## 2020-05-31 ENCOUNTER — Encounter (HOSPITAL_COMMUNITY): Payer: Self-pay | Admitting: Psychiatry

## 2020-05-31 ENCOUNTER — Other Ambulatory Visit: Payer: Self-pay

## 2020-05-31 DIAGNOSIS — F411 Generalized anxiety disorder: Secondary | ICD-10-CM

## 2020-05-31 MED ORDER — BUSPIRONE HCL 10 MG PO TABS: 20.0000 mg | ORAL_TABLET | Freq: Two times a day (BID) | ORAL | 2 refills | Status: AC

## 2020-05-31 MED ORDER — LAMOTRIGINE 200 MG PO TABS: 200.0000 mg | ORAL_TABLET | Freq: Every evening | ORAL | 0 refills | Status: DC

## 2020-05-31 NOTE — Progress Notes (Signed)
BH MD/PA/NP OP Progress Note  05/31/2020 12:50 PM Sudiksha Victor  MRN:  [id number] Interview was conducted by phone and I verified that I was speaking with the correct person using two identifiers. I discussed the limitations of evaluation and management by telemedicine and  the availability of in person appointments. Patient expressed understanding and agreed to proceed. Participants in the visit: patient (location - home); physician (location - home office).  Chief Complaint: anxiety  HPI: 20 yo single white female with GAD, OCD, MDD and adult ADHD. She was previously receiving care from Dr.Puchilowska who is no longer with [facility].She is back at school and doing ok. 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 [name] reports some anxiety which comes on and off and appears to reflect her tendency to obsess about various things. She also admits to compulsions at times eg needs to drink water over and over to decrease anxiety but "it does not help much". She has had these problems since she was a child. She reports no depression. She takes hydroxyzine 10 mg prn with fair effect but mostly forgets to do it. She has been now on a higher 300 mg dose of Effexor with decrease of anxiety/obsessiveness. Also takes buspirone 20 mg bid and trazodone prn insomnia. All appear to work well. No adverse effects reported. No hopelessness, no SI reported. She started at [employer] last semester and noticed more problems with focusing, concentration, struggling with assignment completion, procrastination. These problems are not new but seems to become more apparent in a new environment. She has two first cousions diagnosed with ADHD. There is no hyperactivity component described. We added Wellbutrin XL but it did not help with concentration. Adderall XR did and she is now on 50 mg daily dose. She is overweight and it  is possible that venlafaxine has been contributing to that. We have switched to fluoxetine 40 mg which she tolerates well. In October she became more depressed, "moody" and passively suicidal - following advice from her therapist she started [facility] and now moved to IOP which she will complete in two days. There is a strong possibility of her having bipolar spectrum disorder but they decided to wait for [place] to meet rather than start her on a mood stabilizer while in IOP. Her mood has improved some but still fluctuates; she no longer reports feeling suicidal but had to drop few classes because of her mental health issues. She is trying to loose weight but did not tolerate topiramate and fentermine prescribed by her OBGYN for weight loss. We have started Lamictal titration for mood stabilization and she tolerates it well. [name] has asked if she can go back to venlafaxine as she found it more effective for anxiety than fluoxetine.We have started Pristiq 50 mg but so far her tendency to obsess has not diminished. She also had few panic attacks which were not helped by hydroxyzine prn (needed lorazepam in ED).    Visit Diagnosis:  No diagnosis found.  Past Psychiatric History: Please see intake H&P.  Past Medical History:  Past Medical History:  Diagnosis Date  . Concussion 04/2017  . Depression     Past Surgical History:  Procedure Laterality Date  . KNEE SURGERY     2017    Family Psychiatric History: Reviewed.  Family History:  Family History  Problem Relation Age  of Onset  . Bipolar disorder Maternal Uncle   . Drug abuse Maternal Uncle   . Suicidality Maternal Uncle   . Suicidality Other        completed suicide  . ADD / ADHD Cousin     Social History:  Social History   Socioeconomic History  . Marital status: Single    Spouse name: Not on file  . Number of children: Not on file  . Years of education: Not on file  . Highest education level: Not on file   Occupational History  . Occupation: Consulting civil engineerstudent  Tobacco Use  . Smoking status: Never Smoker  . Smokeless tobacco: Never Used  Vaping Use  . Vaping Use: Never used  Substance and Sexual Activity  . Alcohol use: No  . Drug use: No  . Sexual activity: Yes    Birth control/protection: Implant  Other Topics Concern  . Not on file  Social History Narrative  . Not on file   Social Determinants of Health   Financial Resource Strain: Not on file  Food Insecurity: Not on file  Transportation Needs: Not on file  Physical Activity: Not on file  Stress: Not on file  Social Connections: Not on file    Allergies: No Known Allergies  Metabolic Disorder Labs: No results found for: HGBA1C, MPG No results found for: PROLACTIN No results found for: CHOL, TRIG, HDL, CHOLHDL, VLDL, LDLCALC Lab Results  Component Value Date   TSH 2.76 06/12/2017    Therapeutic Level Labs: No results found for: LITHIUM No results found for: VALPROATE No components found for:  CBMZ  Current Medications: Current Outpatient Medications  Medication Sig Dispense Refill  . amphetamine-dextroamphetamine (ADDERALL XR) 25 MG 24 hr capsule Take 2 capsules by mouth daily. 60 capsule 0  . amphetamine-dextroamphetamine (ADDERALL XR) 25 MG 24 hr capsule Take 2 capsules by mouth every morning. 60 capsule 0  . amphetamine-dextroamphetamine (ADDERALL XR) 25 MG 24 hr capsule Take 2 capsules by mouth every morning. 60 capsule 0  . busPIRone (BUSPAR) 10 MG tablet Take 2 tablets (20 mg total) by mouth 2 (two) times daily. 120 tablet 2  . desvenlafaxine (PRISTIQ) 100 MG 24 hr tablet Take 1 tablet (100 mg total) by mouth daily. 30 tablet 2  . etonogestrel (NEXPLANON) 68 MG IMPL implant 1 each by Subdermal route once.    . hydrOXYzine (ATARAX/VISTARIL) 25 MG tablet Take 1 tablet (25 mg total) by mouth 2 (two) times daily as needed for anxiety. 60 tablet 2  . lamoTRIgine (LAMICTAL) 200 MG tablet Take 1 tablet (200 mg total) by  mouth at bedtime. 90 tablet 0  . LORazepam (ATIVAN) 1 MG tablet Take 1 tablet (1 mg total) by mouth daily as needed for anxiety. 30 tablet 1  . meloxicam (MOBIC) 7.5 MG tablet Take 1 tablet (7.5 mg total) by mouth daily. 90 tablet 0  . traZODone (DESYREL) 50 MG tablet Take one or two each evening 120 tablet 1   No current facility-administered medications for this visit.     Psychiatric Specialty Exam: Review of Systems  Cardiovascular: Positive for palpitations.  Psychiatric/Behavioral: The patient is nervous/anxious.   All other systems reviewed and are negative.   There were no vitals taken for this visit.There is no height or weight on file to calculate BMI.  General Appearance: NA  Eye Contact:  NA  Speech:  Clear and Coherent and Normal Rate  Volume:  Normal  Mood:  Anxious  Affect:  NA  Thought  Process:  Goal Directed  Orientation:  Full (Time, Place, and Person)  Thought Content: Obsessions and Rumination   Suicidal Thoughts:  No  Homicidal Thoughts:  No  Memory:  Immediate;   Good Recent;   Good Remote;   Good  Judgement:  Good  Insight:  Fair  Psychomotor Activity:  NA  Concentration:  Concentration: Good  Recall:  Good  Fund of Knowledge: Good  Language: Good  Akathisia:  Negative  Handed:  Right  AIMS (if indicated): not done  Assets:  Communication Skills Desire for Improvement Financial Resources/Insurance Housing Social Support Vocational/Educational  ADL's:  Intact  Cognition: WNL  Sleep:  Good   Screenings: GAD-7    Advertising copywriter from 09/09/2017 in BEHAVIORAL HEALTH OUTPATIENT THERAPY [place]  Total GAD-7 Score 19      PHQ2-9    Flowsheet Row Counselor from 09/09/2017 in BEHAVIORAL HEALTH OUTPATIENT THERAPY   PHQ-2 Total Score 5  PHQ-9 Total Score 23        Assessment and Plan:  19 single white female with GAD, OCD, MDD and adult ADHD. Iantha reports some anxiety which comes on and off and appears to reflect her  tendency to obsess about various things. She also admits to compulsions at times eg needs to drink water over and over to decrease anxiety but "it does not help much". She has had these problems since she was a child. She reports no depression. She takes hydroxyzine 10 mg prn with fair effect but mostly forgets to do it. She has been now on a higher 300 mg dose of Effexor with decrease of anxiety/obsessiveness. Also takes buspirone 20 mg bid and trazodone prn insomnia. All appear to work well. No adverse effects reported. No hopelessness, no SI reported. She started at [employer] last semester and noticed more problems with focusing, concentration, struggling with assignment completion, procrastination. These problems are not new but seems to become more apparent in a new environment. She has two first cousions diagnosed with ADHD. There is no hyperactivity component described. We added Wellbutrin XL but it did not help with concentration. Adderall XR did and she is now on 50 mg daily dose. She is overweight and it is possible that venlafaxine has been contributing to that. We have switched to fluoxetine 40 mg which she tolerates well. In October she became more depressed, "moody" and passively suicidal - following advice from her therapist she started [facility] and now moved to IOP which she will complete in two days. There is a strong possibility of her having bipolar spectrum disorder but they decided to wait for [place] to meet rather than start her on a mood stabilizer while in IOP. Her mood has improved some but still fluctuates; she no longer reports feeling suicidal but had to drop few classes because of her mental health issues. She is trying to loose weight but did not tolerate topiramate and fentermine prescribed by her OBGYN for weight loss. We have started Lamictal titration for mood stabilization and she tolerates it well. Cloria has asked if she can go back to venlafaxine as she found it more  effective for anxiety than fluoxetine.We have started Pristiq 50 mg but so far her tendency to obsess has not diminished. She also had few panic attacks which were not helped by hydroxyzine prn (needed lorazepam in ED).   Dx: Bipolar 2 disorder, rapid cycling; GAD/OCD/Panic disorder; ADD adult   Plan: We will continue Lamictal 200 mg at HS, continue desvenlafaxine to  100 mg daily, continue trazodone, buspirone, hydroxyzine and Adderall XR  50 mg, lorzepam 1 mg prn severe panic attacks. Next appointment in 2 months. The plan was discussed with patient who had an opportunity to ask questions and these were all answered. I spend 20 minutes in phone consultation  with the patient. I spent 20 minutes on chart review.    Patrick North, MD 05/31/2020, 12:50 PM

## 2020-07-02 ENCOUNTER — Telehealth (INDEPENDENT_AMBULATORY_CARE_PROVIDER_SITE_OTHER): Payer: 59 | Admitting: Psychiatry

## 2020-07-02 ENCOUNTER — Encounter (HOSPITAL_COMMUNITY): Payer: Self-pay | Admitting: Psychiatry

## 2020-07-02 ENCOUNTER — Other Ambulatory Visit: Payer: Self-pay

## 2020-07-02 DIAGNOSIS — F909 Attention-deficit hyperactivity disorder, unspecified type: Secondary | ICD-10-CM | POA: Diagnosis not present

## 2020-07-02 DIAGNOSIS — F411 Generalized anxiety disorder: Secondary | ICD-10-CM

## 2020-07-02 DIAGNOSIS — F410 Panic disorder [episodic paroxysmal anxiety] without agoraphobia: Secondary | ICD-10-CM

## 2020-07-02 MED ORDER — AMPHET-DEXTROAMPHET 3-BEAD ER 25 MG PO CP24: 25.0000 mg | ORAL_CAPSULE | Freq: Every day | ORAL | 0 refills | Status: DC

## 2020-07-02 MED ORDER — DESVENLAFAXINE SUCCINATE ER 100 MG PO TB24: 100.0000 mg | ORAL_TABLET | Freq: Every day | ORAL | 2 refills | Status: AC

## 2020-07-02 MED ORDER — LAMOTRIGINE 200 MG PO TABS: 200.0000 mg | ORAL_TABLET | Freq: Every evening | ORAL | 0 refills | Status: AC

## 2020-07-02 NOTE — Progress Notes (Signed)
BH MD/PA/NP OP Progress Note  07/02/2020 1:32 PM Caroline Lopez  MRN:  [id number] Interview was conducted by phone and I verified that I was speaking with the correct person using two identifiers. I discussed the limitations of evaluation and management by telemedicine and  the availability of in person appointments. Patient expressed understanding and agreed to proceed. Participants in the visit: patient (location - home); physician (location - home office).  Chief Complaint: doing better  HPI: 20 yo single white female with GAD, OCD, MDD and adult ADHD. She was previously receiving care from Dr.Puchilowska who is no longer with [facility].She reports doing better, school is done and she is able to relax. Having difficulty sleeping and wonders if the adderall could be causing her insomnia. She is open to decreasing the dose of adderall. She has not been taking any more 3rd shifts at work and feeling much better. She will start summer classes soon. .Also takes buspirone 20 mg bid and trazodone prn insomnia. All appear to work well. No adverse effects reported. No hopelessness, no SI reported.   Visit Diagnosis:    ICD-10-CM   1. Panic disorder  F41.0   2. Adult ADHD  F90.9   3. GAD (generalized anxiety disorder)  F41.1     Past Psychiatric History: Please see intake H&P.  Past Medical History:  Past Medical History:  Diagnosis Date  . Concussion 04/2017  . Depression     Past Surgical History:  Procedure Laterality Date  . KNEE SURGERY     2017    Family Psychiatric History: Reviewed.  Family History:  Family History  Problem Relation Age of Onset  . Bipolar disorder Maternal Uncle   . Drug abuse Maternal Uncle   . Suicidality Maternal Uncle   . Suicidality Other        completed suicide  . ADD / ADHD Cousin     Social History:  Social History   Socioeconomic History  . Marital status: Single    Spouse name: Not on file  . Number of children: Not on file  . Years  of education: Not on file  . Highest education level: Not on file  Occupational History  . Occupation: Consulting civil engineer  Tobacco Use  . Smoking status: Never Smoker  . Smokeless tobacco: Never Used  Vaping Use  . Vaping Use: Never used  Substance and Sexual Activity  . Alcohol use: No  . Drug use: No  . Sexual activity: Yes    Birth control/protection: Implant  Other Topics Concern  . Not on file  Social History Narrative  . Not on file   Social Determinants of Health   Financial Resource Strain: Not on file  Food Insecurity: Not on file  Transportation Needs: Not on file  Physical Activity: Not on file  Stress: Not on file  Social Connections: Not on file    Allergies: No Known Allergies  Metabolic Disorder Labs: No results found for: HGBA1C, MPG No results found for: PROLACTIN No results found for: CHOL, TRIG, HDL, CHOLHDL, VLDL, LDLCALC Lab Results  Component Value Date   TSH 2.76 06/12/2017    Therapeutic Level Labs: No results found for: LITHIUM No results found for: VALPROATE No components found for:  CBMZ  Current Medications: Current Outpatient Medications  Medication Sig Dispense Refill  . amphetamine-dextroamphetamine (ADDERALL XR) 25 MG 24 hr capsule Take 2 capsules by mouth daily. 60 capsule 0  . amphetamine-dextroamphetamine (ADDERALL XR) 25 MG 24 hr capsule Take 2 capsules by  mouth every morning. 60 capsule 0  . amphetamine-dextroamphetamine (ADDERALL XR) 25 MG 24 hr capsule Take 2 capsules by mouth every morning. 60 capsule 0  . busPIRone (BUSPAR) 10 MG tablet Take 2 tablets (20 mg total) by mouth 2 (two) times daily. 120 tablet 2  . desvenlafaxine (PRISTIQ) 100 MG 24 hr tablet Take 1 tablet (100 mg total) by mouth daily. 30 tablet 2  . etonogestrel (NEXPLANON) 68 MG IMPL implant 1 each by Subdermal route once.    . hydrOXYzine (ATARAX/VISTARIL) 25 MG tablet Take 1 tablet (25 mg total) by mouth 2 (two) times daily as needed for anxiety. 60 tablet 2  .  lamoTRIgine (LAMICTAL) 200 MG tablet Take 1 tablet (200 mg total) by mouth at bedtime. 90 tablet 0  . meloxicam (MOBIC) 7.5 MG tablet Take 1 tablet (7.5 mg total) by mouth daily. 90 tablet 0  . traZODone (DESYREL) 50 MG tablet Take one or two each evening 120 tablet 1   No current facility-administered medications for this visit.     Psychiatric Specialty Exam: Review of Systems  Cardiovascular: Positive for palpitations.  Psychiatric/Behavioral: The patient is nervous/anxious.   All other systems reviewed and are negative.   There were no vitals taken for this visit.There is no height or weight on file to calculate BMI.  General Appearance: NA  Eye Contact:  NA  Speech:  Clear and Coherent and Normal Rate  Volume:  Normal  Mood:  better  Affect:  NA  Thought Process:  Goal Directed  Orientation:  Full (Time, Place, and Person)  Thought Content: Obsessions and Rumination   Suicidal Thoughts:  No  Homicidal Thoughts:  No  Memory:  Immediate;   Good Recent;   Good Remote;   Good  Judgement:  Good  Insight:  Fair  Psychomotor Activity:  NA  Concentration:  Concentration: Good  Recall:  Good  Fund of Knowledge: Good  Language: Good  Akathisia:  Negative  Handed:  Right  AIMS (if indicated): not done  Assets:  Communication Skills Desire for Improvement Financial Resources/Insurance Housing Social Support Vocational/Educational  ADL's:  Intact  Cognition: WNL  Sleep:  poor   Screenings: GAD-7   Advertising copywriter from 09/09/2017 in BEHAVIORAL HEALTH OUTPATIENT THERAPY [place]  Total GAD-7 Score 19    PHQ2-9   Flowsheet Row Counselor from 09/09/2017 in BEHAVIORAL HEALTH OUTPATIENT THERAPY [place]  PHQ-2 Total Score 5  PHQ-9 Total Score 23       Assessment and Plan:  19 single white female with GAD, OCD, MDD and adult ADHD. Caroline Lopez reports some anxiety which comes on and off and appears to reflect her tendency to obsess about various things. She also  admits to compulsions at times eg needs to drink water over and over to decrease anxiety but "it does not help much". She has had these problems since she was a child. She reports no depression. She takes hydroxyzine 10 mg prn with fair effect but mostly forgets to do it. She has been now on a higher 300 mg dose of Effexor with decrease of anxiety/obsessiveness. Also takes buspirone 20 mg bid and trazodone prn insomnia. All appear to work well. No adverse effects reported. No hopelessness, no SI reported. She started at [employer] last semester and noticed more problems with focusing, concentration, struggling with assignment completion, procrastination. These problems are not new but seems to become more apparent in a new environment. She has two first cousions diagnosed with ADHD. There is no hyperactivity  component described. We added Wellbutrin XL but it did not help with concentration. Adderall XR did and she is now on 50 mg daily dose. She is overweight and it is possible that venlafaxine has been contributing to that. We have switched to fluoxetine 40 mg which she tolerates well. In October she became more depressed, "moody" and passively suicidal - following advice from her therapist she started [facility] and now moved to IOP which she will complete in two days. There is a strong possibility of her having bipolar spectrum disorder but they decided to wait for [place] to meet rather than start her on a mood stabilizer while in IOP. Her mood has improved some but still fluctuates; she no longer reports feeling suicidal but had to drop few classes because of her mental health issues. She is trying to loose weight but did not tolerate topiramate and fentermine prescribed by her OBGYN for weight loss. We have started Lamictal titration for mood stabilization and she tolerates it well. Terriana has asked if she can go back to venlafaxine as she found it more effective for anxiety than fluoxetine.We have started  Pristiq 50 mg but so far her tendency to obsess has not diminished. She also had few panic attacks which were not helped by hydroxyzine prn (needed lorazepam in ED).   Dx: Bipolar 2 disorder, rapid cycling; GAD/OCD/Panic disorder; ADD adult   Plan: We will continue Lamictal 200 mg at HS, continue desvenlafaxine to 100 mg daily, continue trazodone, buspirone, hydroxyzine and decrease Adderall XR to 25 mg,  Next appointment in 2 months. The plan was discussed with patient who had an opportunity to ask questions and these were all answered. I spend 10 minutes in phone consultation  with the patient.     Patrick North, MD 07/02/2020, 1:32 PM

## 2020-07-17 ENCOUNTER — Telehealth (HOSPITAL_COMMUNITY): Payer: Self-pay | Admitting: *Deleted

## 2020-07-17 NOTE — Telephone Encounter (Signed)
Did Dr Daleen Bo going to see her in July? I saw appointment with her.

## 2020-07-17 NOTE — Telephone Encounter (Signed)
Writer spoke with pt twice today regarding refilling Adderall XR 25mg . Pt has seen Dr. and she changed script to Mydayis 25 mg CP 24 qd. Pt was previously on Adderall XR 25 mg BID. Pt spoke with pharmacy and they stated that there is a PA needed for the brand Mydayis whereas pt was able to afford the generic Adderall XR. Pt states she's fine with either one if necessary but is currently at [facility] and just needs script filled. We can do PA if needed. Please review and advise.

## 2020-07-17 NOTE — Telephone Encounter (Signed)
Caroline Lopez was due to see her on 08/27/20

## 2020-07-18 ENCOUNTER — Telehealth (HOSPITAL_COMMUNITY): Payer: Self-pay | Admitting: *Deleted

## 2020-07-18 NOTE — Telephone Encounter (Signed)
Placed call to patient to advise of cancelled appointment and need to find psychiatric services from another provider.  Patient informed about letter coming with resources.  Patient to call if refills are needed.  Left message.

## 2020-07-20 MED ORDER — AMPHETAMINE-DEXTROAMPHET ER 25 MG PO CP24: 50.0000 mg | ORAL_CAPSULE | Freq: Every day | ORAL | 0 refills | Status: AC

## 2020-07-20 NOTE — Telephone Encounter (Signed)
A thirty days bridge supply of Adderall XR 25 mg BID is send to [facility] in [place] by Covering MD. Patient must established care with new provider.

## 2020-07-23 ENCOUNTER — Telehealth (HOSPITAL_COMMUNITY): Payer: Self-pay | Admitting: *Deleted

## 2020-07-23 NOTE — Telephone Encounter (Signed)
It should be Adderall XR 25 mg.  Not 30 mg.  I recent prescription on June 3 which was on her chart.  Please see previous messages.

## 2020-07-23 NOTE — Telephone Encounter (Signed)
Error pt has Adderall XR 30 day refilled on 07/20/20.

## 2020-07-23 NOTE — Telephone Encounter (Signed)
Former pt of Dr. Windy Carina has called a second time requesting a refill of (generic) Adderall not the Mydayis as she has been notified by pharmacy that insurance rejected claim for the Mydayis. Dr. Daleen Bo prescribed Mydayis, not Dr. Hinton Dyer. Pt says that she is working and taking summer classes at [employer] and has not had ADHD meds for sometime she says and is struggling.

## 2020-08-03 ENCOUNTER — Telehealth (HOSPITAL_COMMUNITY): Payer: Self-pay

## 2020-08-03 NOTE — Telephone Encounter (Signed)
This is a previous patient of Dr. Hinton Dyer. Received a PA from [facility] in [place] for patient's Adderall XR 25mg . I spoke with the pharmacist and notified him that patient was going to have to find a new provider but that a 30 day bridge supply was sent in. stated that they could go ahead and fill it without doing the PA and that next time refill is due they'll send it to patient's new provider. Channing Mutters is aware and checking to see if letter was sent. If not, she'll send a letter out

## 2020-08-27 ENCOUNTER — Telehealth (HOSPITAL_COMMUNITY): Payer: 59 | Admitting: Psychiatry
